# Patient Record
Sex: Male | Born: 1985 | Race: White | Hispanic: No | Marital: Single | Smoking: Current every day smoker
Health system: Southern US, Community
[De-identification: ages and names within clinical notes are randomized; demographics above are authoritative.]

## PROBLEM LIST (undated history)

## (undated) DIAGNOSIS — K219 Gastro-esophageal reflux disease without esophagitis: Secondary | ICD-10-CM

## (undated) DIAGNOSIS — I10 Essential (primary) hypertension: Secondary | ICD-10-CM

## (undated) DIAGNOSIS — F319 Bipolar disorder, unspecified: Secondary | ICD-10-CM

## (undated) DIAGNOSIS — E119 Type 2 diabetes mellitus without complications: Secondary | ICD-10-CM

## (undated) DIAGNOSIS — G4730 Sleep apnea, unspecified: Secondary | ICD-10-CM

## (undated) HISTORY — PX: ABDOMINAL SURGERY: SHX537

## (undated) HISTORY — DX: Gastro-esophageal reflux disease without esophagitis: K21.9

## (undated) HISTORY — PX: HERNIA REPAIR: SHX51

## (undated) HISTORY — DX: Bipolar disorder, unspecified: F31.9

---

## 2020-04-15 ENCOUNTER — Emergency Department (HOSPITAL_COMMUNITY)
Admission: EM | Admit: 2020-04-15 | Discharge: 2020-04-15 | Disposition: A | Discharge status: Home / Self Care | Payer: Medicaid Other | Attending: Emergency Medicine | Admitting: Emergency Medicine

## 2020-04-15 ENCOUNTER — Encounter (HOSPITAL_COMMUNITY): Payer: Self-pay | Admitting: Emergency Medicine

## 2020-04-15 ENCOUNTER — Emergency Department (HOSPITAL_COMMUNITY): Payer: Medicaid Other

## 2020-04-15 ENCOUNTER — Other Ambulatory Visit: Payer: Self-pay

## 2020-04-15 DIAGNOSIS — Y999 Unspecified external cause status: Secondary | ICD-10-CM | POA: Diagnosis not present

## 2020-04-15 DIAGNOSIS — S6991XA Unspecified injury of right wrist, hand and finger(s), initial encounter: Secondary | ICD-10-CM | POA: Diagnosis present

## 2020-04-15 DIAGNOSIS — S62344A Nondisplaced fracture of base of fourth metacarpal bone, right hand, initial encounter for closed fracture: Secondary | ICD-10-CM | POA: Diagnosis not present

## 2020-04-15 DIAGNOSIS — F17210 Nicotine dependence, cigarettes, uncomplicated: Secondary | ICD-10-CM | POA: Diagnosis not present

## 2020-04-15 DIAGNOSIS — Y929 Unspecified place or not applicable: Secondary | ICD-10-CM | POA: Insufficient documentation

## 2020-04-15 DIAGNOSIS — W228XXA Striking against or struck by other objects, initial encounter: Secondary | ICD-10-CM | POA: Insufficient documentation

## 2020-04-15 DIAGNOSIS — Y9389 Activity, other specified: Secondary | ICD-10-CM | POA: Insufficient documentation

## 2020-04-15 HISTORY — DX: Type 2 diabetes mellitus without complications: E11.9

## 2020-04-15 HISTORY — DX: Essential (primary) hypertension: I10

## 2020-04-15 MED ORDER — HYDROCODONE-ACETAMINOPHEN 5-325 MG PO TABS: 1.0000 | ORAL_TABLET | Freq: Four times a day (QID) | ORAL | 0 refills | Status: DC | PRN

## 2020-04-15 MED ORDER — OXYCODONE-ACETAMINOPHEN 5-325 MG PO TABS
1.0000 | ORAL_TABLET | Freq: Once | ORAL | Status: AC
  Administered 2020-04-15: 1 via ORAL
  Filled 2020-04-15: qty 1
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
## 2020-04-15 NOTE — Discharge Instructions (Signed)
You were seen today and likely have a small fracture at the base of your fourth metacarpal.  You also have a lucency over one of the bones in your wrist although you are not tender there.  You need to follow-up with orthopedist for repeat x-rays.  Take medications as prescribed.
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
## 2020-04-24 ENCOUNTER — Ambulatory Visit: Payer: Self-pay | Admitting: Orthopedic Surgery

## 2020-04-24 ENCOUNTER — Other Ambulatory Visit: Payer: Self-pay

## 2020-04-24 ENCOUNTER — Encounter: Payer: Self-pay | Admitting: Orthopedic Surgery

## 2020-04-24 VITALS — BP 176/111 | HR 89 | Ht 75.0 in | Wt 362.0 lb

## 2020-04-24 DIAGNOSIS — S6991XA Unspecified injury of right wrist, hand and finger(s), initial encounter: Secondary | ICD-10-CM
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
## 2020-05-16 ENCOUNTER — Emergency Department (HOSPITAL_COMMUNITY)
Admission: EM | Admit: 2020-05-16 | Discharge: 2020-05-16 | Disposition: A | Discharge status: Home / Self Care | Payer: Medicaid Other | Attending: Emergency Medicine | Admitting: Emergency Medicine

## 2020-05-16 ENCOUNTER — Emergency Department (HOSPITAL_COMMUNITY): Payer: Medicaid Other

## 2020-05-16 ENCOUNTER — Other Ambulatory Visit: Payer: Self-pay

## 2020-05-16 ENCOUNTER — Encounter (HOSPITAL_COMMUNITY): Payer: Self-pay | Admitting: *Deleted

## 2020-05-16 ENCOUNTER — Ambulatory Visit: Payer: Self-pay | Admitting: Orthopedic Surgery

## 2020-05-16 DIAGNOSIS — I10 Essential (primary) hypertension: Secondary | ICD-10-CM | POA: Diagnosis not present

## 2020-05-16 DIAGNOSIS — F17210 Nicotine dependence, cigarettes, uncomplicated: Secondary | ICD-10-CM | POA: Diagnosis not present

## 2020-05-16 DIAGNOSIS — M25461 Effusion, right knee: Secondary | ICD-10-CM | POA: Insufficient documentation

## 2020-05-16 DIAGNOSIS — E119 Type 2 diabetes mellitus without complications: Secondary | ICD-10-CM | POA: Diagnosis not present

## 2020-05-16 DIAGNOSIS — M25561 Pain in right knee: Secondary | ICD-10-CM | POA: Diagnosis not present

## 2020-05-16 MED ORDER — "KNEE IMMOBILIZER 22"" MISC": 1.0000 | Freq: Every day | 0 refills | Status: DC

## 2020-05-16 MED ORDER — HYDROCODONE-ACETAMINOPHEN 5-325 MG PO TABS: 1.0000 | ORAL_TABLET | ORAL | 0 refills | Status: DC | PRN

## 2020-05-16 NOTE — ED Provider Notes (Signed)
[facility] EMERGENCY DEPARTMENT Provider Note   CSN: [id number] Arrival date & time: 05/16/20  1505     History Chief Complaint  Patient presents with  . Knee Pain    Derrick Barnes is a 34 y.o. male with a history of diabetes and hypertension, also reports had a hairline fracture of his right tibia in February when he was living in [place] here with complaint of right knee pain since tripping and falling several days ago.  He was walking and stepped in a hole, describing his body falling to the right while his foot was in the whole, causing pain and stress to his right lateral knee.  He also endorses pain across the top of his kneecap.  He has pain and swelling which is worse with weightbearing, better at rest.  He has applied ice without significant pain improvement.  He is currently under the care of Dr. Romeo Apple for a right fourth metacarpal fracture.  The history is provided by the patient.       Past Medical History:  Diagnosis Date  . Diabetes mellitus without complication (HCC)   . Hypertension     There are no problems to display for this patient.   Past Surgical History:  Procedure Laterality Date  . ABDOMINAL SURGERY    . HERNIA REPAIR         History reviewed. No pertinent family history.  Social History   Tobacco Use  . Smoking status: Current Every Day Smoker    Packs/day: 0.25    Types: Cigarettes  . Smokeless tobacco: Never Used  Substance Use Topics  . Alcohol use: Not Currently  . Drug use: Not Currently    Home Medications Prior to Admission medications   Medication Sig Start Date End Date Taking? Authorizing Provider  Elastic Bandages & Supports (KNEE IMMOBILIZER 22") MISC 1 Device by Does not apply route daily. 05/16/20   Burgess Amor, PA-C  HYDROcodone-acetaminophen (NORCO/VICODIN) 5-325 MG tablet Take 1 tablet by mouth every 4 (four) hours as needed for moderate pain. 05/16/20   Burgess Amor, PA-C  ibuprofen (ADVIL) 800 MG tablet Take 800 mg by  mouth every 8 (eight) hours as needed.    [provider]    Allergies    Trulicity [dulaglutide]  Review of Systems   Review of Systems  Constitutional: Negative for fever.  Musculoskeletal: Positive for arthralgias and joint swelling. Negative for myalgias.  Skin: Negative.   Neurological: Negative for weakness and numbness.  All other systems reviewed and are negative.   Physical Exam Updated Vital Signs BP (!) 144/102   Pulse 83   Temp (!) 97.1 F (36.2 C) (Temporal)   Resp 18   Ht 6\' 3"  (1.905 m)   Wt (!) 164.2 kg   SpO2 99%   BMI 45.25 kg/m   Physical Exam Constitutional:      Appearance: He is well-developed.  HENT:     Head: Atraumatic.  Cardiovascular:     Comments: Pulses equal bilaterally Musculoskeletal:        General: Tenderness present.     Cervical back: Normal range of motion.     Right knee: Bony tenderness present. No swelling or deformity. No LCL laxity, MCL laxity, ACL laxity or PCL laxity.     Comments: Tender to palpation along the lateral right knee joint space. Pain elicited to palpation of the LCL and across the upper patellar border.  No palpable tendon deformity.  Pt can SLR the right leg without knee  pain or collapse. No crepitus.  No knee joint instability.  No effusion.  Skin:    General: Skin is warm and dry.  Neurological:     Mental Status: He is alert.     Sensory: No sensory deficit.     Deep Tendon Reflexes: Reflexes normal.     ED Results / Procedures / Treatments   Labs (all labs ordered are listed, but only abnormal results are displayed) Labs Reviewed - No data to display  EKG None  Radiology DG Knee Complete 4 Views Right  Result Date: 05/16/2020 CLINICAL DATA:  Pain after prior fall EXAM: RIGHT KNEE - COMPLETE 4+ VIEW COMPARISON:  None. FINDINGS: Frontal, lateral, and bilateral oblique views were obtained. No fracture or dislocation. There is a joint effusion. The patella is somewhat inferiorly positioned.  There is no appreciable joint space narrowing or erosion. IMPRESSION: Somewhat inferiorly positioned patella. There is a joint effusion. No appreciable fracture or dislocation. No appreciable underlying arthropathic change. Electronically Signed   By: Bretta Bang III M.D.   On: 05/16/2020 16:52    Procedures Procedures (including critical care time)  Medications Ordered in ED Medications - No data to display  ED Course  I have reviewed the triage vital signs and the nursing notes.  Pertinent labs & imaging results that were available during my care of the patient were reviewed by me and considered in my medical decision making (see chart for details).    MDM Rules/Calculators/A&P                          Imaging reviewed and discussed with pt. Imaging suggesting low riding patella, but no exam findings suggesting tendon or muscle rupture of thigh musculature.  ttp lateral knee joint space.  Small effusion.  Suspect knee sprain involving the patella and LCL.  Body habitus does not accommodate our knee immobilizers.  He was given a script to have one fitted at [facility], crutches provided.  Discussed home care of [facility] and close f/u with Dr. Romeo Apple for eval/management of this new injury. Final Clinical Impression(s) / ED Diagnoses Final diagnoses:  Acute pain of right knee  Effusion of right knee    Rx / DC Orders ED Discharge Orders         Ordered    HYDROcodone-acetaminophen (NORCO/VICODIN) 5-325 MG tablet  Every 4 hours PRN     Discontinue  Reprint     05/16/20 1816    Elastic Bandages & Supports (KNEE IMMOBILIZER 22") MISC  Daily     Discontinue  Reprint     05/16/20 1920           Burgess Amor, PA-C 05/17/20 1047    Terrilee Files, MD 05/17/20 1121

## 2020-05-16 NOTE — Discharge Instructions (Signed)
As discussed you do have an effusion in your right knee joint space suspicious for soft tissue injury, possibly a tendon or ligament injury.  Wear the knee immobilizer as discussed to help protect your knee joint.  Use the crutches to minimize weightbearing as needed.  Applying ice to the knee as much as possible will help also with pain and swelling.  You have been prescribed hydrocodone to help you with pain relief, do not drive within 4 hours of taking this medication as it will make you drowsy.  Call Dr. Romeo Apple for an office visit for further evaluation of this new injury.

## 2020-05-16 NOTE — ED Triage Notes (Signed)
Pt fell 6 months ago and injured right knee, states pain is getting worse.  Hard to put weight on it per pt.

## 2020-05-18 ENCOUNTER — Telehealth: Payer: Self-pay | Admitting: Orthopedic Surgery

## 2020-05-18 NOTE — Telephone Encounter (Signed)
Upon re-scheduling patient's appointment from 05/16/20 which patient missed, he stated he has a new problem, knee pain, for which he was seen at [facility] emergency room on 05/16/20. Discussed appointment, as well as reviewed his new Medicaid insurance plan, [employer]; relayed [facility] is out of network. He will pursue with his caseworker and/or with Medicaid for further information.

## 2020-05-24 ENCOUNTER — Ambulatory Visit: Payer: Medicaid Other | Admitting: Orthopedic Surgery

## 2020-05-28 ENCOUNTER — Ambulatory Visit (INDEPENDENT_AMBULATORY_CARE_PROVIDER_SITE_OTHER): Payer: Medicaid Other | Admitting: Physician Assistant

## 2020-05-28 ENCOUNTER — Encounter: Payer: Self-pay | Admitting: Physician Assistant

## 2020-05-28 ENCOUNTER — Other Ambulatory Visit: Payer: Self-pay

## 2020-05-28 ENCOUNTER — Ambulatory Visit (INDEPENDENT_AMBULATORY_CARE_PROVIDER_SITE_OTHER): Payer: Medicaid Other

## 2020-05-28 DIAGNOSIS — M25561 Pain in right knee: Secondary | ICD-10-CM | POA: Diagnosis not present

## 2020-05-28 DIAGNOSIS — G8929 Other chronic pain: Secondary | ICD-10-CM | POA: Diagnosis not present

## 2020-05-28 DIAGNOSIS — M25361 Other instability, right knee: Secondary | ICD-10-CM | POA: Diagnosis not present

## 2020-05-28 NOTE — Progress Notes (Signed)
Office Visit Note   Patient: Derrick Barnes           Date of Birth: [birth year]           MRN: [id number] Visit Date: 05/28/2020              Requested by: No referring provider defined for this encounter. PCP: Patient, No Pcp Per  Chief Complaint  Patient presents with   Right Knee - Pain      HPI: This is a pleasant 34 year old gentleman with a 73-month history of right knee pain swelling and instability.  He states he had a fall when he was living in [place] in February of this year.  He had pain and swelling.  He was seen by a provider who told him he had a small fracture of his tibia.  He still had some difficulties but got somewhat better.  Approximately 5 weeks ago he jumped off a porch landing awkwardly on his knee.  Since then he has had increased global pain and swelling.  He states that his knee gives way and he falls because of this.  He also has a sense of locking and catching.  He does think he appreciated a pop when he sustained his second injury it hurts him to walk and he works as a Designer, jewellery & Plan: Visit Diagnoses:  1. Chronic pain of right knee   2. Knee instability, right     Plan: Patient has had ongoing knee pain and instability more accentuated since June.  He has locking and catching and popping.  He has an antalgic gait and has fallen secondary to the instability.  I recommend a MRI with follow-up afterwards.  I did offer him an injection into his knee today he declined this  Follow-Up Instructions: No follow-ups on file.   Ortho Exam  Patient is alert, oriented, no adenopathy, well-dressed, normal affect, normal respiratory effort. Right knee: Moderate soft tissue swelling but no frank effusion.  Tender around the medial lateral joint lines.  Has some increased translation with anterior draw pain with terminal extension and flexion.  No cellulitis  Imaging: No results found. No images are attached to the encounter.  Labs: No  results found for: HGBA1C, ESRSEDRATE, CRP, LABURIC, REPTSTATUS, GRAMSTAIN, CULT, LABORGA   No results found for: ALBUMIN, PREALBUMIN, LABURIC  No results found for: MG No results found for: VD25OH  No results found for: PREALBUMIN No flowsheet data found.   There is no height or weight on file to calculate BMI.  Orders:  Orders Placed This Encounter  Procedures   XR Knee 1-2 Views Right   MR Knee Right w/o contrast   No orders of the defined types were placed in this encounter.    Procedures: No procedures performed  Clinical Data: No additional findings.  ROS:  All other systems negative, except as noted in the HPI. Review of Systems  Objective: Vital Signs: There were no vitals taken for this visit.  Specialty Comments:  No specialty comments available.  PMFS History: There are no problems to display for this patient.  No past medical history on file.  No family history on file.   Social History   Occupational History   Not on file  Tobacco Use   Smoking status: Not on file  Substance and Sexual Activity   Alcohol use: Not on file   Drug use: Not on file   Sexual activity: Not on file

## 2020-05-29 ENCOUNTER — Encounter (HOSPITAL_COMMUNITY): Payer: Self-pay | Admitting: *Deleted

## 2020-06-19 ENCOUNTER — Telehealth: Payer: Self-pay | Admitting: Orthopedic Surgery

## 2020-06-19 NOTE — Telephone Encounter (Signed)
Pt has been called twice with the the schedulers at [facility] and they come up with this: Missing or Invalid Number: Details Recording: "The subscriber you have dialed is not in service."  I called and I get number can not be completed at this time, please hang up and try again later.

## 2020-06-19 NOTE — Telephone Encounter (Signed)
Pt called stating a referral for an MRI in [place] was supposed to be placed but it's been two weeks and no one has called him; pt would like a CB with an update  [phone]

## 2020-06-19 NOTE — Telephone Encounter (Signed)
Can you follow up on this? Pt had MRI order 05/28/20 and still has not heard back but see that Berkley Harvey has been obtained.

## 2020-06-20 ENCOUNTER — Encounter: Payer: Self-pay | Admitting: Radiology

## 2020-06-20 NOTE — Telephone Encounter (Signed)
Thanks, I changed the number in the demographic to where imaging can see it also and took the old one out. I also sent message to imaging letting them know to contact pt again to schedule.

## 2020-06-20 NOTE — Telephone Encounter (Signed)
Noted  

## 2020-06-20 NOTE — Telephone Encounter (Signed)
I also tried to call the pt at the number provided [phone] recording says unable to complete call at this time and to try again later. Called the number listed as a home number in his chart [phone] and was able to speak with the pt. He said that this number would be the best number to reach him the other is not a working number for him any longer. Can we please call to set up this MRI?

## 2020-06-30 ENCOUNTER — Other Ambulatory Visit: Payer: Self-pay

## 2020-06-30 ENCOUNTER — Ambulatory Visit (HOSPITAL_BASED_OUTPATIENT_CLINIC_OR_DEPARTMENT_OTHER)
Admission: RE | Admit: 2020-06-30 | Discharge: 2020-06-30 | Disposition: A | Discharge status: Home / Self Care | Payer: Medicaid Other | Source: Ambulatory Visit | Attending: Physician Assistant | Admitting: Physician Assistant

## 2020-06-30 DIAGNOSIS — M25361 Other instability, right knee: Secondary | ICD-10-CM | POA: Diagnosis not present

## 2020-07-05 ENCOUNTER — Emergency Department (HOSPITAL_COMMUNITY): Payer: Medicaid Other

## 2020-07-05 ENCOUNTER — Other Ambulatory Visit: Payer: Self-pay

## 2020-07-05 ENCOUNTER — Encounter (HOSPITAL_COMMUNITY): Payer: Self-pay | Admitting: *Deleted

## 2020-07-05 ENCOUNTER — Emergency Department (HOSPITAL_COMMUNITY)
Admission: EM | Admit: 2020-07-05 | Discharge: 2020-07-05 | Disposition: A | Discharge status: Home / Self Care | Payer: Medicaid Other | Attending: Emergency Medicine | Admitting: Emergency Medicine

## 2020-07-05 DIAGNOSIS — I10 Essential (primary) hypertension: Secondary | ICD-10-CM | POA: Diagnosis not present

## 2020-07-05 DIAGNOSIS — Y9389 Activity, other specified: Secondary | ICD-10-CM | POA: Insufficient documentation

## 2020-07-05 DIAGNOSIS — Y9289 Other specified places as the place of occurrence of the external cause: Secondary | ICD-10-CM | POA: Insufficient documentation

## 2020-07-05 DIAGNOSIS — S80911A Unspecified superficial injury of right knee, initial encounter: Secondary | ICD-10-CM | POA: Diagnosis present

## 2020-07-05 DIAGNOSIS — S83241A Other tear of medial meniscus, current injury, right knee, initial encounter: Secondary | ICD-10-CM | POA: Diagnosis not present

## 2020-07-05 DIAGNOSIS — F17210 Nicotine dependence, cigarettes, uncomplicated: Secondary | ICD-10-CM | POA: Insufficient documentation

## 2020-07-05 DIAGNOSIS — X58XXXA Exposure to other specified factors, initial encounter: Secondary | ICD-10-CM | POA: Insufficient documentation

## 2020-07-05 DIAGNOSIS — Y998 Other external cause status: Secondary | ICD-10-CM | POA: Insufficient documentation

## 2020-07-05 DIAGNOSIS — E119 Type 2 diabetes mellitus without complications: Secondary | ICD-10-CM | POA: Insufficient documentation

## 2020-07-05 MED ORDER — MELOXICAM 7.5 MG PO TABS
7.5000 mg | ORAL_TABLET | Freq: Two times a day (BID) | ORAL | 0 refills | Status: AC | PRN
Start: 2020-07-05 — End: 2020-07-19

## 2020-07-05 NOTE — ED Triage Notes (Signed)
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

## 2020-07-05 NOTE — ED Notes (Signed)
Pt states he can't put weight on that right knee

## 2020-07-05 NOTE — Discharge Instructions (Addendum)
Please follow-up with your orthopedic surgeon.  You may take either ibuprofen or Mobic as prescribed, do not take both of these together.  Rest your knee, use ice packs and elevate to help reduce swelling.  Try to not walk on this for the next couple of days  Medical exam in the emergency department for severe worsening symptoms.

## 2020-07-05 NOTE — ED Provider Notes (Signed)
[facility] EMERGENCY DEPARTMENT Provider Note   CSN: [id number] Arrival date & time: 07/05/20  2138     History Chief Complaint  Patient presents with  . Fall    Derrick Barnes is a 34 y.o. male.  HPI   This patient is a 34 year old male, he is a known diabetic as well as having high blood pressure.  He had a knee injury which she has been struggling with and had an orthopedic surgeon evaluation.  An MRI was ordered which showed a torn meniscus medial on the right knee.  That was several days ago.  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  He then had difficulty straightening or bending the knee and or walking on it.  He has not had any medication for this, it is constant, it does get better with certain positions and now he holds it in slight flexion because of pain relief.  He denies any other injuries.  Past Medical History:  Diagnosis Date  . Diabetes mellitus without complication (HCC)   . Hypertension     There are no problems to display for this patient.   Past Surgical History:  Procedure Laterality Date  . ABDOMINAL SURGERY    . HERNIA REPAIR         History reviewed. No pertinent family history.  Social History   Tobacco Use  . Smoking status: Current Every Day Smoker    Packs/day: 0.25    Types: Cigarettes  . Smokeless tobacco: Never Used  Substance Use Topics  . Alcohol use: Not Currently  . Drug use: Not Currently    Home Medications Prior to Admission medications   Medication Sig Start Date End Date Taking? Authorizing Provider  Elastic Bandages & Supports (KNEE IMMOBILIZER 22") MISC 1 Device by Does not apply route daily. 05/16/20   Burgess Amor, PA-C  HYDROcodone-acetaminophen (NORCO/VICODIN) 5-325 MG tablet Take 1 tablet by mouth every 4 (four) hours as needed for moderate pain. 05/16/20   Burgess Amor, PA-C    ibuprofen (ADVIL) 800 MG tablet Take 800 mg by mouth every 8 (eight) hours as needed.    [provider]  meloxicam (MOBIC) 7.5 MG tablet Take 1 tablet (7.5 mg total) by mouth 2 (two) times daily as needed for up to 14 days for pain. 07/05/20 07/19/20  Eber Hong, MD    Allergies    Alfalfa and Trulicity [dulaglutide]  Review of Systems   Review of Systems  Gastrointestinal: Negative for nausea and vomiting.  Musculoskeletal: Positive for joint swelling.  Neurological: Negative for weakness and numbness.    Physical Exam Updated Vital Signs BP (!) 151/98 (BP Location: Left Arm)   Pulse 93   Temp 98.4 F (36.9 C) (Oral)   Resp 16   Ht 1.905 m (6\' 3" )   Wt (!) 158.8 kg   SpO2 99%   BMI 43.75 kg/m   Physical Exam Vitals and nursing note reviewed.  Constitutional:      Appearance: He is well-developed. He is not diaphoretic.  HENT:     Head: Normocephalic and atraumatic.  Eyes:     General:        Right eye: No discharge.        Left eye: No discharge.     Conjunctiva/sclera: Conjunctivae normal.  Pulmonary:  Effort: Pulmonary effort is normal. No respiratory distress.  Musculoskeletal:     Comments: Mild swelling of the right knee, he is able to fully extend and fully flex but with pain.  He has a clinical effusion which is small.  He has tenderness over both the medial and lateral aspects of the knee joint as well as over the anterior aspect.  There is no bruising or injury to the skin  Skin:    General: Skin is warm and dry.     Findings: No erythema or rash.  Neurological:     Mental Status: He is alert.     Coordination: Coordination normal.     ED Results / Procedures / Treatments   Labs (all labs ordered are listed, but only abnormal results are displayed) Labs Reviewed - No data to display  EKG None  Radiology DG Knee Complete 4 Views Right  Result Date: 07/05/2020 CLINICAL DATA:  Fall, right knee pain EXAM: RIGHT KNEE - COMPLETE 4+ VIEW  COMPARISON:  None. FINDINGS: Four view radiograph right knee demonstrates normal alignment. No fracture or dislocation. Joint spaces are preserved. Small right knee effusion is present. Soft tissues are otherwise unremarkable. IMPRESSION: Small right knee effusion. No acute bony abnormality. Electronically Signed   By: Helyn Numbers MD   On: 07/05/2020 22:18    Procedures Procedures (including critical care time)  Medications Ordered in ED Medications - No data to display  ED Course  I have reviewed the triage vital signs and the nursing notes.  Pertinent labs & imaging results that were available during my care of the patient were reviewed by me and considered in my medical decision making (see chart for details).    MDM Rules/Calculators/A&P                          Small clinical effusion, internal derangement of the knee has already occurred with a medial meniscus tear based on his MRI report which I have reviewed.  The patient will need orthopedic follow-up.  He likely has a sprain of the knee.  He already has crutches and states he does not fit into knee immobilizers.  I have offered him each of these.  He is stable for discharge, can follow-up in the outpatient setting.  Anti-inflammatories and orthopedic follow-up  Final Clinical Impression(s) / ED Diagnoses Final diagnoses:  Tear of medial meniscus of right knee, current, unspecified tear type, initial encounter    Rx / DC Orders ED Discharge Orders         Ordered    meloxicam (MOBIC) 7.5 MG tablet  2 times daily PRN        07/05/20 2237           Eber Hong, MD 07/05/20 2237

## 2020-11-21 ENCOUNTER — Emergency Department (HOSPITAL_COMMUNITY)
Admission: EM | Admit: 2020-11-21 | Discharge: 2020-11-21 | Disposition: A | Discharge status: Home / Self Care | Payer: Medicaid Other | Attending: Emergency Medicine | Admitting: Emergency Medicine

## 2020-11-21 ENCOUNTER — Other Ambulatory Visit: Payer: Self-pay

## 2020-11-21 ENCOUNTER — Encounter (HOSPITAL_COMMUNITY): Payer: Self-pay

## 2020-11-21 DIAGNOSIS — R059 Cough, unspecified: Secondary | ICD-10-CM

## 2020-11-21 DIAGNOSIS — E119 Type 2 diabetes mellitus without complications: Secondary | ICD-10-CM | POA: Insufficient documentation

## 2020-11-21 DIAGNOSIS — I10 Essential (primary) hypertension: Secondary | ICD-10-CM | POA: Insufficient documentation

## 2020-11-21 DIAGNOSIS — R509 Fever, unspecified: Secondary | ICD-10-CM | POA: Diagnosis present

## 2020-11-21 DIAGNOSIS — Z20822 Contact with and (suspected) exposure to covid-19: Secondary | ICD-10-CM

## 2020-11-21 DIAGNOSIS — U071 COVID-19: Secondary | ICD-10-CM | POA: Diagnosis not present

## 2020-11-21 DIAGNOSIS — J029 Acute pharyngitis, unspecified: Secondary | ICD-10-CM

## 2020-11-21 DIAGNOSIS — F17210 Nicotine dependence, cigarettes, uncomplicated: Secondary | ICD-10-CM | POA: Diagnosis not present

## 2020-11-21 DIAGNOSIS — Z8616 Personal history of COVID-19: Secondary | ICD-10-CM

## 2020-11-21 HISTORY — DX: Personal history of COVID-19: Z86.16

## 2020-11-21 LAB — GROUP A STREP BY PCR: Group A Strep by PCR: NOT DETECTED

## 2020-11-21 MED ORDER — NAPROXEN 250 MG PO TABS
500.0000 mg | ORAL_TABLET | Freq: Once | ORAL | Status: AC
  Administered 2020-11-21: 500 mg via ORAL
  Filled 2020-11-21: qty 2

## 2020-11-21 MED ORDER — BENZONATATE 100 MG PO CAPS
100.0000 mg | ORAL_CAPSULE | Freq: Three times a day (TID) | ORAL | 0 refills | Status: DC
Start: 2020-11-21 — End: 2021-01-14

## 2020-11-21 NOTE — ED Provider Notes (Signed)
[facility] EMERGENCY DEPARTMENT Provider Note   CSN: [id number] Arrival date & time: 11/21/20  1425     History Chief Complaint  Patient presents with  . Generalized Body Aches    Derrick Barnes is a 35 y.o. male with positive history significant for diabetes, hypertension who presents for evaluation of fever.  Woke this morning with a fever of 103.0.  He has also had myalgias, sore throat.  States he has a cough however has a chronic cough due to tobacco use.  No chest pain or shortness of breath.  He received 2 doses of COVID-vaccine who was not due for booster until March.  No known exposures.  Is able to tolerate p.o. intake without difficulty.  States his throat feels "scratchy."  Denies unilateral neck pain, drooling, dysphagia, trismus, congestion, rhinorrhea, chest pain, shortness of breath abdominal pain, diarrhea, dysuria.  Denies additional aggravating or alleviating factors.  Took Tylenol 2 hours PTA.  History obtained from patient and past medical records. No interpretor was used.  HPI     Past Medical History:  Diagnosis Date  . Diabetes mellitus without complication (HCC)   . Hypertension     There are no problems to display for this patient.   Past Surgical History:  Procedure Laterality Date  . ABDOMINAL SURGERY    . HERNIA REPAIR         No family history on file.  Social History   Tobacco Use  . Smoking status: Current Every Day Smoker    Packs/day: 0.25    Types: Cigarettes  . Smokeless tobacco: Never Used  Substance Use Topics  . Alcohol use: Not Currently  . Drug use: Not Currently    Home Medications Prior to Admission medications   Medication Sig Start Date End Date Taking? Authorizing Provider  benzonatate (TESSALON) 100 MG capsule Take 1 capsule (100 mg total) by mouth every 8 (eight) hours. 11/21/20  Yes Izaiyah Kleinman A, PA-C  Elastic Bandages & Supports (KNEE IMMOBILIZER 22") MISC 1 Device by Does not apply route daily. 05/16/20    Burgess Amor, PA-C  HYDROcodone-acetaminophen (NORCO/VICODIN) 5-325 MG tablet Take 1 tablet by mouth every 4 (four) hours as needed for moderate pain. 05/16/20   Burgess Amor, PA-C  ibuprofen (ADVIL) 800 MG tablet Take 800 mg by mouth every 8 (eight) hours as needed.    [provider]    Allergies    Alfalfa and Trulicity [dulaglutide]  Review of Systems   Review of Systems  Constitutional: Positive for activity change, appetite change, fatigue and fever.  HENT: Positive for congestion, rhinorrhea and sore throat. Negative for trouble swallowing and voice change.   Respiratory: Positive for cough.   Cardiovascular: Negative.   Genitourinary: Negative.   Musculoskeletal: Negative.   Skin: Negative.   Neurological: Negative.   All other systems reviewed and are negative.   Physical Exam Updated Vital Signs BP 132/87 (BP Location: Right Arm)   Pulse (!) 102   Temp 99.4 F (37.4 C) (Oral)   Resp 18   Ht 6\' 4"  (1.93 m)   Wt (!) 152 kg   SpO2 100%   BMI 40.78 kg/m   Physical Exam Vitals and nursing note reviewed.  Constitutional:      General: He is not in acute distress.    Appearance: He is not ill-appearing, toxic-appearing or diaphoretic.  HENT:     Head: Normocephalic and atraumatic.     Jaw: There is normal jaw occlusion.  Right Ear: Tympanic membrane, ear canal and external ear normal. There is no impacted cerumen. No hemotympanum. Tympanic membrane is not injected, scarred, perforated, erythematous, retracted or bulging.     Left Ear: Tympanic membrane, ear canal and external ear normal. There is no impacted cerumen. No hemotympanum. Tympanic membrane is not injected, scarred, perforated, erythematous, retracted or bulging.     Ears:     Comments: No Mastoid tenderness.    Nose:     Comments: Clear rhinorrhea and congestion to bilateral nares.  No sinus tenderness.    Mouth/Throat:     Comments: Posterior oropharynx clear.  Mucous membranes moist.  Tonsils  without erythema or exudate.  Uvula midline without deviation.  No evidence of PTA or RPA.  No drooling, dysphasia or trismus.  Phonation normal. Neck:     Trachea: Trachea and phonation normal.     Comments: No Neck stiffness or neck rigidity.  No meningismus.  No cervical lymphadenopathy. Cardiovascular:     Comments: No murmurs rubs or gallops. Pulmonary:     Comments: Clear to auscultation bilaterally without wheeze, rhonchi or rales.  No accessory muscle usage.  Able speak in full sentences. Abdominal:     Comments: Soft, nontender without rebound or guarding.  No CVA tenderness.  Musculoskeletal:     Comments: Moves all 4 extremities without difficulty.  Lower extremities without edema, erythema or warmth.  Skin:    Comments: Brisk capillary refill.  No rashes or lesions.  Neurological:     Mental Status: He is alert.     Comments: Ambulatory in department without difficulty.  Cranial nerves II through XII grossly intact.  No facial droop.  No aphasia.     ED Results / Procedures / Treatments   Labs (all labs ordered are listed, but only abnormal results are displayed) Labs Reviewed  GROUP A STREP BY PCR  SARS CORONAVIRUS 2 (TAT 6-24 HRS)    EKG None  Radiology No results found.  Procedures Procedures (including critical care time)  Medications Ordered in ED Medications  naproxen (NAPROSYN) tablet 500 mg (500 mg Oral Given 11/21/20 1638)   ED Course  I have reviewed the triage vital signs and the nursing notes.  Pertinent labs & imaging results that were available during my care of the patient were reviewed by me and considered in my medical decision making (see chart for details).  Patient presents for evaluation of upper respiratory complaints. On arrival he is afebrile, nonseptic, not ill-appearing.  No evidence of PTA or RPA.  No phonation changes.  No neck stiffness or neck rigidity.  No meningismus.  His heart and lungs are clear.  No unilateral leg swelling,  redness or warmth.  Strep test performed here did not show evidence of strep pharyngitis.  Question viral upper respiratory infection, question COVID?  COVID test was obtained.  He will follow-up on results on MyChart.  I discussed symptomatic management at home.  He is agreeable for this. Will return for new or worsening symptoms.  low suspicion for acute bacterial infection at this time requiring antibiotics or admission to hospital  The patient has been appropriately medically screened and/or stabilized in the ED. I have low suspicion for any other emergent medical condition which would require further screening, evaluation or treatment in the ED or require inpatient management.  Patient is hemodynamically stable and in no acute distress.  Patient able to ambulate in department prior to ED.  Evaluation does not show acute pathology that would require  ongoing or additional emergent interventions while in the emergency department or further inpatient treatment.  I have discussed the diagnosis with the patient and answered all questions.  Pain is been managed while in the emergency department and patient has no further complaints prior to discharge.  Patient is comfortable with plan discussed in room and is stable for discharge at this time.  I have discussed strict return precautions for returning to the emergency department.  Patient was encouraged to follow-up with PCP/specialist refer to at discharge.    MDM Rules/Calculators/A&P                          Derrick Barnes was evaluated in Emergency Department on 11/21/2020 for the symptoms described in the history of present illness. He was evaluated in the context of the global COVID-19 pandemic, which necessitated consideration that the patient might be at risk for infection with the SARS-CoV-2 virus that causes COVID-19. Institutional protocols and algorithms that pertain to the evaluation of patients at risk for COVID-19 are in a state of rapid change  based on information released by regulatory bodies including the CDC and federal and state organizations. These policies and algorithms were followed during the patient's care in the ED. Final Clinical Impression(s) / ED Diagnoses Final diagnoses:  Person under investigation for COVID-19  Sore throat  Cough    Rx / DC Orders ED Discharge Orders         Ordered    benzonatate (TESSALON) 100 MG capsule  Every 8 hours        11/21/20 1733           Twain Stenseth A, PA-C 11/21/20 1737    Eber Hong, MD 11/22/20 2129

## 2020-11-21 NOTE — ED Triage Notes (Signed)
Pt presents to ED with complaints of generalized body aches, sore throat, fever up to 103 started this am.

## 2020-11-21 NOTE — Discharge Instructions (Addendum)
Your strep test was negative  Your COVID test was pending at discharge.  As we discussed in the room.  These results will be on MyChart.  On the bottom of your discharge instructions there are instructions on how to set up this online account to get your results.  May take Tylenol and ibuprofen as needed for pain.  Return for new or worsening symptoms.  If COVID test is positive will need to be out of work for 1 week.  I have written a work note for that if your COVID test result is positive

## 2020-11-22 LAB — SARS CORONAVIRUS 2 (TAT 6-24 HRS): SARS Coronavirus 2: POSITIVE — AB

## 2020-12-20 ENCOUNTER — Ambulatory Visit (INDEPENDENT_AMBULATORY_CARE_PROVIDER_SITE_OTHER): Payer: Medicaid Other | Admitting: Orthopedic Surgery

## 2020-12-20 DIAGNOSIS — M23221 Derangement of posterior horn of medial meniscus due to old tear or injury, right knee: Secondary | ICD-10-CM | POA: Diagnosis not present

## 2020-12-28 ENCOUNTER — Encounter: Payer: Self-pay | Admitting: Orthopedic Surgery

## 2020-12-28 NOTE — Progress Notes (Signed)
   Office Visit Note   Patient: Derrick Barnes           Date of Birth: 08-Nov-1986           MRN: [id number] Visit Date: 12/20/2020              Requested by: [facility] [address] [place],  [place] [address] PCP: Ponciano Ort, [facility]  Chief Complaint  Patient presents with  . Right Knee - Pain      HPI: Patient is a 35 year old gentleman who presents with progressive pain in his right knee he states he is fallen 4 times has grinding in the knee. MRI scan obtained last year showed a torn meniscus. Patient currently is ambulating with a cane complains of global pain and swelling start up stiffness locking and giving way.  Assessment & Plan: Visit Diagnoses:  1. Derang of post horn of medial mensc d/t old tear/inj, r knee     Plan: We will plan for arthroscopic debridement of the right knee risk and benefits were discussed including persistent pain need for additional surgery. Patient states he understands wished to proceed at this time.  Follow-Up Instructions: Return if symptoms worsen or fail to improve.   Ortho Exam  Patient is alert, oriented, no adenopathy, well-dressed, normal affect, normal respiratory effort. Examination patient is tender to palpation of the medial joint line there is no effusion collaterals and cruciates are stable there is no cellulitis. There is crepitation with range of motion.  Imaging: No results found. No images are attached to the encounter.  Labs: No results found for: HGBA1C, ESRSEDRATE, CRP, LABURIC, REPTSTATUS, GRAMSTAIN, CULT, LABORGA   No results found for: ALBUMIN, PREALBUMIN, LABURIC  No results found for: MG No results found for: VD25OH  No results found for: PREALBUMIN No flowsheet data found.   There is no height or weight on file to calculate BMI.  Orders:  No orders of the defined types were placed in this encounter.  No orders of the defined types were placed in this encounter.    Procedures: No  procedures performed  Clinical Data: No additional findings.  ROS:  All other systems negative, except as noted in the HPI. Review of Systems  Objective: Vital Signs: There were no vitals taken for this visit.  Specialty Comments:  No specialty comments available.  PMFS History: There are no problems to display for this patient.  Past Medical History:  Diagnosis Date  . Diabetes mellitus without complication (HCC)   . Hypertension     History reviewed. No pertinent family history.  Past Surgical History:  Procedure Laterality Date  . ABDOMINAL SURGERY    . HERNIA REPAIR     Social History   Occupational History  . Not on file  Tobacco Use  . Smoking status: Current Every Day Smoker    Packs/day: 0.25    Types: Cigarettes  . Smokeless tobacco: Never Used  Substance and Sexual Activity  . Alcohol use: Not Currently  . Drug use: Not Currently  . Sexual activity: Not on file

## 2021-01-01 ENCOUNTER — Other Ambulatory Visit: Payer: Self-pay | Admitting: Physician Assistant

## 2021-01-14 ENCOUNTER — Other Ambulatory Visit: Payer: Self-pay

## 2021-01-14 ENCOUNTER — Encounter (HOSPITAL_BASED_OUTPATIENT_CLINIC_OR_DEPARTMENT_OTHER): Payer: Self-pay | Admitting: Orthopedic Surgery

## 2021-01-18 ENCOUNTER — Encounter (HOSPITAL_BASED_OUTPATIENT_CLINIC_OR_DEPARTMENT_OTHER)
Admission: RE | Admit: 2021-01-18 | Discharge: 2021-01-18 | Disposition: A | Discharge status: Home / Self Care | Payer: Medicaid Other | Attending: Orthopedic Surgery | Admitting: Orthopedic Surgery

## 2021-01-18 DIAGNOSIS — Z01812 Encounter for preprocedural laboratory examination: Secondary | ICD-10-CM | POA: Diagnosis present

## 2021-01-18 NOTE — Progress Notes (Signed)

## 2021-01-18 NOTE — Progress Notes (Signed)
Patient had lab work on 01/11/21 including BMET. New labs were not collected. EKG was performed.

## 2021-01-21 ENCOUNTER — Other Ambulatory Visit: Payer: Self-pay

## 2021-01-22 ENCOUNTER — Other Ambulatory Visit: Payer: Self-pay

## 2021-01-22 ENCOUNTER — Ambulatory Visit (HOSPITAL_BASED_OUTPATIENT_CLINIC_OR_DEPARTMENT_OTHER): Payer: Medicaid Other | Admitting: Anesthesiology

## 2021-01-22 ENCOUNTER — Encounter (HOSPITAL_BASED_OUTPATIENT_CLINIC_OR_DEPARTMENT_OTHER): Payer: Self-pay | Admitting: Orthopedic Surgery

## 2021-01-22 ENCOUNTER — Ambulatory Visit (HOSPITAL_BASED_OUTPATIENT_CLINIC_OR_DEPARTMENT_OTHER)
Admission: RE | Admit: 2021-01-22 | Discharge: 2021-01-22 | Disposition: A | Discharge status: Home / Self Care | Payer: Medicaid Other | Attending: Orthopedic Surgery | Admitting: Orthopedic Surgery

## 2021-01-22 ENCOUNTER — Encounter (HOSPITAL_BASED_OUTPATIENT_CLINIC_OR_DEPARTMENT_OTHER)
Admission: RE | Disposition: A | Discharge status: Home / Self Care | Payer: Self-pay | Source: Home / Self Care | Attending: Orthopedic Surgery

## 2021-01-22 DIAGNOSIS — X58XXXA Exposure to other specified factors, initial encounter: Secondary | ICD-10-CM | POA: Insufficient documentation

## 2021-01-22 DIAGNOSIS — S83241A Other tear of medial meniscus, current injury, right knee, initial encounter: Secondary | ICD-10-CM | POA: Insufficient documentation

## 2021-01-22 DIAGNOSIS — E119 Type 2 diabetes mellitus without complications: Secondary | ICD-10-CM | POA: Insufficient documentation

## 2021-01-22 DIAGNOSIS — Z888 Allergy status to other drugs, medicaments and biological substances status: Secondary | ICD-10-CM | POA: Insufficient documentation

## 2021-01-22 DIAGNOSIS — I10 Essential (primary) hypertension: Secondary | ICD-10-CM | POA: Diagnosis not present

## 2021-01-22 DIAGNOSIS — Z7984 Long term (current) use of oral hypoglycemic drugs: Secondary | ICD-10-CM | POA: Diagnosis not present

## 2021-01-22 DIAGNOSIS — F17210 Nicotine dependence, cigarettes, uncomplicated: Secondary | ICD-10-CM | POA: Insufficient documentation

## 2021-01-22 DIAGNOSIS — G4730 Sleep apnea, unspecified: Secondary | ICD-10-CM | POA: Insufficient documentation

## 2021-01-22 DIAGNOSIS — M1711 Unilateral primary osteoarthritis, right knee: Secondary | ICD-10-CM | POA: Diagnosis not present

## 2021-01-22 DIAGNOSIS — Z8616 Personal history of COVID-19: Secondary | ICD-10-CM | POA: Diagnosis not present

## 2021-01-22 HISTORY — DX: Sleep apnea, unspecified: G47.30

## 2021-01-22 HISTORY — PX: KNEE ARTHROSCOPY: SHX127

## 2021-01-22 LAB — GLUCOSE, CAPILLARY
Glucose-Capillary: 110 mg/dL — ABNORMAL HIGH (ref 70–99)
Glucose-Capillary: 134 mg/dL — ABNORMAL HIGH (ref 70–99)

## 2021-01-22 SURGERY — ARTHROSCOPY, KNEE
Anesthesia: Regional | Site: Knee | Laterality: Right

## 2021-01-22 MED ORDER — ACETAMINOPHEN 10 MG/ML IV SOLN
INTRAVENOUS | Status: AC
  Filled 2021-01-22: qty 100

## 2021-01-22 MED ORDER — FENTANYL CITRATE (PF) 100 MCG/2ML IJ SOLN
INTRAMUSCULAR | Status: AC
  Filled 2021-01-22: qty 2

## 2021-01-22 MED ORDER — OXYCODONE HCL 5 MG PO TABS
5.0000 mg | ORAL_TABLET | Freq: Once | ORAL | Status: AC | PRN
  Administered 2021-01-22: 5 mg via ORAL

## 2021-01-22 MED ORDER — ACETAMINOPHEN 10 MG/ML IV SOLN
1000.0000 mg | Freq: Once | INTRAVENOUS | Status: DC | PRN
  Administered 2021-01-22: 1000 mg via INTRAVENOUS

## 2021-01-22 MED ORDER — DEXTROSE 5 % IV SOLN
3.0000 g | INTRAVENOUS | Status: AC
  Administered 2021-01-22: 3 g via INTRAVENOUS
  Filled 2021-01-22: qty 3000

## 2021-01-22 MED ORDER — CEFAZOLIN SODIUM-DEXTROSE 2-4 GM/100ML-% IV SOLN
INTRAVENOUS | Status: AC
  Filled 2021-01-22: qty 100

## 2021-01-22 MED ORDER — PROPOFOL 10 MG/ML IV BOLUS
INTRAVENOUS | Status: DC | PRN
  Administered 2021-01-22: 200 mg via INTRAVENOUS
  Administered 2021-01-22 (×2): 50 mg via INTRAVENOUS

## 2021-01-22 MED ORDER — OXYCODONE HCL 5 MG PO TABS
ORAL_TABLET | ORAL | Status: AC
  Filled 2021-01-22: qty 1

## 2021-01-22 MED ORDER — BUPIVACAINE-EPINEPHRINE (PF) 0.5% -1:200000 IJ SOLN
INTRAMUSCULAR | Status: DC | PRN
  Administered 2021-01-22: 20 mL via PERINEURAL

## 2021-01-22 MED ORDER — FENTANYL CITRATE (PF) 100 MCG/2ML IJ SOLN
25.0000 ug | INTRAMUSCULAR | Status: DC | PRN
Start: 2021-01-22 — End: 2021-01-22
  Administered 2021-01-22 (×3): 50 ug via INTRAVENOUS

## 2021-01-22 MED ORDER — ACETAMINOPHEN 500 MG PO TABS: 1000.0000 mg | ORAL_TABLET | Freq: Once | ORAL | Status: DC | PRN

## 2021-01-22 MED ORDER — PROPOFOL 10 MG/ML IV BOLUS
INTRAVENOUS | Status: AC
  Filled 2021-01-22: qty 40

## 2021-01-22 MED ORDER — OXYCODONE HCL 5 MG/5ML PO SOLN
5.0000 mg | Freq: Once | ORAL | Status: AC | PRN
Start: 2021-01-22 — End: 2021-01-22

## 2021-01-22 MED ORDER — ACETAMINOPHEN 160 MG/5ML PO SOLN: 1000.0000 mg | Freq: Once | ORAL | Status: DC | PRN

## 2021-01-22 MED ORDER — LIDOCAINE 2% (20 MG/ML) 5 ML SYRINGE
INTRAMUSCULAR | Status: AC
  Filled 2021-01-22: qty 5

## 2021-01-22 MED ORDER — FENTANYL CITRATE (PF) 250 MCG/5ML IJ SOLN
INTRAMUSCULAR | Status: DC | PRN
  Administered 2021-01-22: 100 ug via INTRAVENOUS

## 2021-01-22 MED ORDER — CEFAZOLIN SODIUM-DEXTROSE 1-4 GM/50ML-% IV SOLN
INTRAVENOUS | Status: AC
  Filled 2021-01-22: qty 50

## 2021-01-22 MED ORDER — OXYCODONE-ACETAMINOPHEN 5-325 MG PO TABS: 1.0000 | ORAL_TABLET | ORAL | 0 refills | Status: DC | PRN

## 2021-01-22 MED ORDER — LACTATED RINGERS IV SOLN: INTRAVENOUS | Status: DC

## 2021-01-22 MED ORDER — DEXAMETHASONE SODIUM PHOSPHATE 10 MG/ML IJ SOLN
INTRAMUSCULAR | Status: DC | PRN
  Administered 2021-01-22: 4 mg via INTRAVENOUS

## 2021-01-22 MED ORDER — DEXMEDETOMIDINE HCL IN NACL 200 MCG/50ML IV SOLN
INTRAVENOUS | Status: DC | PRN
  Administered 2021-01-22: 16 ug via INTRAVENOUS

## 2021-01-22 MED ORDER — LIDOCAINE 2% (20 MG/ML) 5 ML SYRINGE
INTRAMUSCULAR | Status: DC | PRN
  Administered 2021-01-22: 40 mg via INTRAVENOUS

## 2021-01-22 SURGICAL SUPPLY — 27 items
BLADE EXCALIBUR 4.0X13 (MISCELLANEOUS) ×2 IMPLANT
BNDG COHESIVE 6X5 TAN STRL LF (GAUZE/BANDAGES/DRESSINGS) ×2 IMPLANT
COVER WAND RF STERILE (DRAPES) IMPLANT
DISSECTOR 4.0MM X 13CM (MISCELLANEOUS) IMPLANT
DRAPE ARTHROSCOPY W/POUCH 90 (DRAPES) ×2 IMPLANT
DRAPE U-SHAPE 47X51 STRL (DRAPES) ×2 IMPLANT
DRSG EMULSION OIL 3X3 NADH (GAUZE/BANDAGES/DRESSINGS) ×2 IMPLANT
DURAPREP 26ML APPLICATOR (WOUND CARE) ×2 IMPLANT
EXCALIBUR 3.8MM X 13CM (MISCELLANEOUS) IMPLANT
GAUZE SPONGE 4X4 12PLY STRL (GAUZE/BANDAGES/DRESSINGS) ×2 IMPLANT
GLOVE SURG ORTHO LTX SZ9 (GLOVE) ×2 IMPLANT
GLOVE SURG UNDER POLY LF SZ9 (GLOVE) ×2 IMPLANT
GOWN STRL REUS W/ TWL LRG LVL3 (GOWN DISPOSABLE) ×1 IMPLANT
GOWN STRL REUS W/ TWL XL LVL3 (GOWN DISPOSABLE) ×1 IMPLANT
GOWN STRL REUS W/TWL LRG LVL3 (GOWN DISPOSABLE) ×2
GOWN STRL REUS W/TWL XL LVL3 (GOWN DISPOSABLE) ×2
MANIFOLD NEPTUNE II (INSTRUMENTS) IMPLANT
NDL SAFETY ECLIPSE 18X1.5 (NEEDLE) ×1 IMPLANT
NEEDLE HYPO 18GX1.5 SHARP (NEEDLE) ×2
PACK ARTHROSCOPY DSU (CUSTOM PROCEDURE TRAY) ×2 IMPLANT
PACK BASIN DAY SURGERY FS (CUSTOM PROCEDURE TRAY) ×2 IMPLANT
PORT APPOLLO RF 90DEGREE MULTI (SURGICAL WAND) IMPLANT
PROBE APOLLO 90XL (SURGICAL WAND) IMPLANT
SUT ETHILON 2 0 FSLX (SUTURE) ×2 IMPLANT
TOWEL GREEN STERILE FF (TOWEL DISPOSABLE) ×2 IMPLANT
TUBING ARTHROSCOPY IRRIG 16FT (MISCELLANEOUS) ×2 IMPLANT
WRAP KNEE MAXI GEL POST OP (GAUZE/BANDAGES/DRESSINGS) IMPLANT

## 2021-01-22 NOTE — Anesthesia Procedure Notes (Addendum)
Anesthesia Regional Block: Adductor canal block   Pre-Anesthetic Checklist: ,, timeout performed, Correct Patient, Correct Site, Correct Laterality, Correct Procedure, Correct Position, site marked, Risks and benefits discussed,  Surgical consent,  Pre-op evaluation,  At surgeon's request and post-op pain management  Laterality: Right and Lower  Prep: chloraprep       Needles:  Injection technique: Single-shot     Needle Length: 9cm  Needle Gauge: 22     Additional Needles: Arrow StimuQuik ECHO Echogenic Stimulating PNB Needle  Procedures:,,,, ultrasound used (permanent image in chart),,,,  Narrative:  Start time: 01/22/2021 8:50 AM End time: 01/22/2021 8:53 AM Injection made incrementally with aspirations every 5 mL.  Performed by: Personally  Anesthesiologist: Val Eagle, MD

## 2021-01-22 NOTE — Op Note (Signed)
01/22/2021  9:35 AM  PATIENT:  Derrick Barnes    PRE-OPERATIVE DIAGNOSIS:  Medical Meniscus Tear Right Knee  POST-OPERATIVE DIAGNOSIS:  Same  PROCEDURE:  RIGHT KNEE ARTHROSCOPY AND DEBRIDEMENT  SURGEON:  Nadara Mustard, MD  PHYSICIAN ASSISTANT:None ANESTHESIA:   General  PREOPERATIVE INDICATIONS:  Derrick Barnes is a  35 y.o. male with a diagnosis of Medical Meniscus Tear Right Knee who failed conservative measures and elected for surgical management.    The risks benefits and alternatives were discussed with the patient preoperatively including but not limited to the risks of infection, bleeding, nerve injury, cardiopulmonary complications, the need for revision surgery, among others, and the patient was willing to proceed.  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  OPERATIVE PROCEDURE: Patient was brought the operating room and underwent a general anesthetic.  After adequate levels anesthesia were obtained patient underwent a femoral block by anesthesia the right lower extremity was then prepped using DuraPrep draped into a sterile field a timeout was called.  The scope was inserted to the anterior lateral portal and an anterior medial working portal established.  Patient had a hemarthrosis and extensive synovitis.  A synovectomy was performed anteriorly to visualize the anterior joint line.  Electrocautery was used hemostasis.  With a valgus stress the posterior medial joint line was evaluated the meniscus was intact but shortened.  The margins were probed and stable.  Patient had a large osteochondral defect the medial femoral condyle this was debrided back to bleeding viable subchondral bone the margins were stable after debridement.  The osteochondral defect was approximately 2 cm in diameter.  Examination the notch showed an intact ACL.  Examination the lateral joint line  figure-of-four position showed intact cartilage of the femur and tibia with an intact lateral meniscus.  With the knee extended the remainder of the synovectomy was performed patient had a large plica that was excised medially.  There is significant mount of synovial ingrowth into the gutters and anterior joint line.  This was resected and electrocautery was used hemostasis a survey of all compartments was then again performed there were no loose bodies.  The instruments were removed the portals were closed using 2-0 nylon a sterile dressing was applied patient was extubated taken the PACU in stable condition   DISCHARGE PLANNING:  Antibiotic duration: Preoperative antibiotics  Weightbearing: Weightbearing as tolerated.  Pain medication: Prescription for Percocet  Dressing care/ Wound VAC: Discontinue dressing in 2 days  Ambulatory devices: Crutches  Discharge to: Home  Follow-up: In the office 1 week post operative.

## 2021-01-22 NOTE — Anesthesia Procedure Notes (Signed)
Procedure Name: LMA Insertion Date/Time: 01/22/2021 8:52 AM Performed by: Lucinda Dell, CRNA Pre-anesthesia Checklist: Patient identified, Suction available, Emergency Drugs available and Patient being monitored Patient Re-evaluated:Patient Re-evaluated prior to induction Oxygen Delivery Method: Circle system utilized Preoxygenation: Pre-oxygenation with 100% oxygen Induction Type: IV induction Ventilation: Mask ventilation without difficulty LMA: LMA inserted LMA Size: 5.0 Number of attempts: 1 Placement Confirmation: positive ETCO2 and breath sounds checked- equal and bilateral Tube secured with: Tape Dental Injury: Teeth and Oropharynx as per pre-operative assessment

## 2021-01-22 NOTE — Transfer of Care (Signed)
Immediate Anesthesia Transfer of Care Note  Patient: Derrick Barnes  Procedure(s) Performed: RIGHT KNEE ARTHROSCOPY AND DEBRIDEMENT (Right Knee)  Patient Location: PACU  Anesthesia Type:GA combined with regional for post-op pain  Level of Consciousness: sedated and responds to stimulation  Airway & Oxygen Therapy: Patient Spontanous Breathing and Patient connected to face mask oxygen  Post-op Assessment: Report given to RN and Post -op Vital signs reviewed and stable  Post vital signs: Reviewed and stable  Last Vitals:  Vitals Value Taken Time  BP    Temp    Pulse 66 01/22/21 0936  Resp 13 01/22/21 0936  SpO2 95 % 01/22/21 0936    Last Pain:  Vitals:   01/22/21 0705  TempSrc: Oral  PainSc: 8       Patients Stated Pain Goal: 5 (01/22/21 0705)  Complications: No complications documented.

## 2021-01-22 NOTE — Discharge Instructions (Signed)
  Post Anesthesia Home Care Instructions  Activity: Get plenty of rest for the remainder of the day. A responsible individual must stay with you for 24 hours following the procedure.  For the next 24 hours, DO NOT: -Drive a car -Advertising copywriter -Drink alcoholic beverages -Take any medication unless instructed by your physician -Make any legal decisions or sign important papers.  Meals: Start with liquid foods such as gelatin or soup. Progress to regular foods as tolerated. Avoid greasy, spicy, heavy foods. If nausea and/or vomiting occur, drink only clear liquids until the nausea and/or vomiting subsides. Call your physician if vomiting continues.  Special Instructions/Symptoms: Your throat may feel dry or sore from the anesthesia or the breathing tube placed in your throat during surgery. If this causes discomfort, gargle with warm salt water. The discomfort should disappear within 24 hours.  If you had a scopolamine patch placed behind your ear for the management of post- operative nausea and/or vomiting:  1. The medication in the patch is effective for 72 hours, after which it should be removed.  Wrap patch in a tissue and discard in the trash. Wash hands thoroughly with soap and water. 2. You may remove the patch earlier than 72 hours if you experience unpleasant side effects which may include dry mouth, dizziness or visual disturbances. 3. Avoid touching the patch. Wash your hands with soap and water after contact with the patch.    Regional Anesthesia Blocks  1. Numbness or the inability to move the "blocked" extremity may last from 3-48 hours after placement. The length of time depends on the medication injected and your individual response to the medication. If the numbness is not going away after 48 hours, call your surgeon.  2. The extremity that is blocked will need to be protected until the numbness is gone and the  Strength has returned. Because you cannot feel it, you will  need to take extra care to avoid injury. Because it may be weak, you may have difficulty moving it or using it. You may not know what position it is in without looking at it while the block is in effect.  3. For blocks in the legs and feet, returning to weight bearing and walking needs to be done carefully. You will need to wait until the numbness is entirely gone and the strength has returned. You should be able to move your leg and foot normally before you try and bear weight or walk. You will need someone to be with you when you first try to ensure you do not fall and possibly risk injury.  4. Bruising and tenderness at the needle site are common side effects and will resolve in a few days.  5. Persistent numbness or new problems with movement should be communicated to the surgeon or the [facility] [phone] [facility] [phone]).  May have Tylenol at 4pm today 01/22/21. May have Percocet after 4:52PM today, 01/22/21.

## 2021-01-22 NOTE — H&P (Signed)
Zacarias Krauter is an 35 y.o. male.   Chief Complaint: Right Knee pain HPI: This is a pleasant 35 year old gentleman with a 62-month history of right knee pain swelling and instability.  He states he had a fall when he was living in [place] in February of this year.  He had pain and swelling.  He was seen by a provider who told him he had a small fracture of his tibia.  He still had some difficulties but got somewhat better.  Approximately 5 weeks ago he jumped off a porch landing awkwardly on his knee.  Since then he has had increased global pain and swelling.  He states that his knee gives way and he falls because of this.  He also has a sense of locking and catching.  He does think he appreciated a pop when he sustained his second injury it hurts him to walk and he works as a Financial planner  Past Medical History:  Diagnosis Date   Diabetes mellitus without complication (HCC)    History of COVID-19 11/21/2020   Hypertension    Sleep apnea     Past Surgical History:  Procedure Laterality Date   ABDOMINAL SURGERY     HERNIA REPAIR      History reviewed. No pertinent family history. Social History:  reports that he has been smoking cigarettes. He has been smoking about 0.25 packs per day. He has never used smokeless tobacco. He reports previous alcohol use. He reports previous drug use.  Allergies:  Allergies  Allergen Reactions   Alfalfa    Trulicity [Dulaglutide]     Medications Prior to Admission  Medication Sig Dispense Refill   ibuprofen (ADVIL) 800 MG tablet Take 800 mg by mouth every 8 (eight) hours as needed.     metFORMIN (GLUCOPHAGE) 500 MG tablet Take by mouth 2 (two) times daily with a meal.      No results found for this or any previous visit (from the past 48 hour(s)). No results found.  Review of Systems  All other systems reviewed and are negative.   Height 6\' 4"  (1.93 m), weight (!) 154.2 kg. Physical Exam  Patient is alert, oriented, no  adenopathy, well-dressed, normal affect, normal respiratory effort. Right knee: Moderate soft tissue swelling but no frank effusion.  Tender around the medial lateral joint lines.  Has some increased translation with anterior draw pain with terminal extension and flexion.  No cellulitis heart RRR Lungs Clear Assessment/Plan 1. Chronic pain of right knee   2. Knee instability, right     Plan: Patient has had ongoing knee pain and instability more accentuated since June.  He has locking and catching and popping.  He has an antalgic gait and has fallen secondary to the instability.  I recommend a MRI with follow-up afterwards.  I did offer him an injection into his knee today he declined this. MRI showed complete radial tear medial meniscus. Will go forward with Knee Arthroscopy   July Del Wiseman, PA 01/22/2021, 6:27 AM

## 2021-01-22 NOTE — Anesthesia Preprocedure Evaluation (Addendum)
Anesthesia Evaluation  Patient identified by MRN, date of birth, ID band Patient awake    Reviewed: Allergy & Precautions, NPO status , Patient's Chart, lab work & pertinent test results  History of Anesthesia Complications Negative for: history of anesthetic complications  Airway Mallampati: II  TM Distance: >3 FB Neck ROM: Full    Dental  (+) Edentulous Upper, Poor Dentition, Missing, Loose,    Pulmonary sleep apnea , Recent URI , Resolved, Current Smoker and Patient abstained from smoking.,  Covid-19 Nucleic Acid Test Results Lab Results      Component                Value               Date                      SARSCOV2NAA              POSITIVE (A)        11/21/2020              breath sounds clear to auscultation       Cardiovascular hypertension,  Rhythm:Regular     Neuro/Psych negative neurological ROS  negative psych ROS   GI/Hepatic negative GI ROS, Neg liver ROS,   Endo/Other  diabetesMorbid obesity  Renal/GU negative Renal ROS     Musculoskeletal Medical Meniscus Tear Right Knee   Abdominal   Peds  Hematology negative hematology ROS (+)   Anesthesia Other Findings   Reproductive/Obstetrics                           Anesthesia Physical Anesthesia Plan  ASA: III  Anesthesia Plan: General and Regional   Post-op Pain Management:  Regional for Post-op pain   Induction: Intravenous  PONV Risk Score and Plan: 1 and Ondansetron and Dexamethasone  Airway Management Planned: LMA and Oral ETT  Additional Equipment: None  Intra-op Plan:   Post-operative Plan: Extubation in OR  Informed Consent: I have reviewed the patients History and Physical, chart, labs and discussed the procedure including the risks, benefits and alternatives for the proposed anesthesia with the patient or authorized representative who has indicated his/her understanding and acceptance.     Dental  advisory given  Plan Discussed with: CRNA and Surgeon  Anesthesia Plan Comments:         Anesthesia Quick Evaluation

## 2021-01-22 NOTE — Interval H&P Note (Signed)
History and Physical Interval Note:  01/22/2021 7:04 AM  Derrick Barnes  has presented today for surgery, with the diagnosis of Medical Meniscus Tear Right Knee.  The various methods of treatment have been discussed with the patient and family. After consideration of risks, benefits and other options for treatment, the patient has consented to  Procedure(s): RIGHT KNEE ARTHROSCOPY AND DEBRIDEMENT (Right) as a surgical intervention.  The patient's history has been reviewed, patient examined, no change in status, stable for surgery.  I have reviewed the patient's chart and labs.  Questions were answered to the patient's satisfaction.     Nadara Mustard

## 2021-01-23 ENCOUNTER — Encounter (HOSPITAL_BASED_OUTPATIENT_CLINIC_OR_DEPARTMENT_OTHER): Payer: Self-pay | Admitting: Orthopedic Surgery

## 2021-01-28 NOTE — Anesthesia Postprocedure Evaluation (Signed)
Anesthesia Post Note  Patient: Derrick Barnes  Procedure(s) Performed: RIGHT KNEE ARTHROSCOPY AND DEBRIDEMENT (Right Knee)     Anesthesia Post Evaluation No complications documented.  Last Vitals:  Vitals:   01/22/21 1025 01/22/21 1104  BP:  (!) 105/56  Pulse: (!) 57 66  Resp: (!) 9 13  Temp:  36.5 C  SpO2: 99% 99%    Last Pain:  Vitals:   01/23/21 1028  TempSrc:   PainSc: 5                  Kylan Liberati

## 2021-01-28 NOTE — Anesthesia Postprocedure Evaluation (Signed)
Anesthesia Post Note  Patient: Derrick Barnes  Procedure(s) Performed: RIGHT KNEE ARTHROSCOPY AND DEBRIDEMENT (Right Knee)     Patient location during evaluation: PACU Anesthesia Type: Regional and General Level of consciousness: patient cooperative and awake Pain management: pain level controlled Vital Signs Assessment: post-procedure vital signs reviewed and stable Respiratory status: spontaneous breathing, nonlabored ventilation, respiratory function stable and patient connected to nasal cannula oxygen Cardiovascular status: blood pressure returned to baseline and stable Postop Assessment: no apparent nausea or vomiting Anesthetic complications: no   No complications documented.  Last Vitals:  Vitals:   01/22/21 1025 01/22/21 1104  BP:  (!) 105/56  Pulse: (!) 57 66  Resp: (!) 9 13  Temp:  36.5 C  SpO2: 99% 99%    Last Pain:  Vitals:   01/23/21 1028  TempSrc:   PainSc: 5                  Viraj Liby

## 2021-02-03 ENCOUNTER — Encounter (HOSPITAL_COMMUNITY): Payer: Self-pay | Admitting: Emergency Medicine

## 2021-02-03 ENCOUNTER — Other Ambulatory Visit: Payer: Self-pay

## 2021-02-03 ENCOUNTER — Emergency Department (HOSPITAL_COMMUNITY): Payer: Medicaid Other

## 2021-02-03 ENCOUNTER — Emergency Department (HOSPITAL_COMMUNITY)
Admission: EM | Admit: 2021-02-03 | Discharge: 2021-02-03 | Disposition: A | Discharge status: Home / Self Care | Payer: Medicaid Other | Attending: Emergency Medicine | Admitting: Emergency Medicine

## 2021-02-03 DIAGNOSIS — W01198A Fall on same level from slipping, tripping and stumbling with subsequent striking against other object, initial encounter: Secondary | ICD-10-CM | POA: Diagnosis not present

## 2021-02-03 DIAGNOSIS — I10 Essential (primary) hypertension: Secondary | ICD-10-CM | POA: Diagnosis not present

## 2021-02-03 DIAGNOSIS — E119 Type 2 diabetes mellitus without complications: Secondary | ICD-10-CM | POA: Insufficient documentation

## 2021-02-03 DIAGNOSIS — M25641 Stiffness of right hand, not elsewhere classified: Secondary | ICD-10-CM | POA: Diagnosis not present

## 2021-02-03 DIAGNOSIS — Y9289 Other specified places as the place of occurrence of the external cause: Secondary | ICD-10-CM | POA: Insufficient documentation

## 2021-02-03 DIAGNOSIS — Z7984 Long term (current) use of oral hypoglycemic drugs: Secondary | ICD-10-CM | POA: Insufficient documentation

## 2021-02-03 DIAGNOSIS — Z8616 Personal history of COVID-19: Secondary | ICD-10-CM | POA: Insufficient documentation

## 2021-02-03 DIAGNOSIS — F17210 Nicotine dependence, cigarettes, uncomplicated: Secondary | ICD-10-CM | POA: Insufficient documentation

## 2021-02-03 DIAGNOSIS — S8001XA Contusion of right knee, initial encounter: Secondary | ICD-10-CM | POA: Diagnosis not present

## 2021-02-03 DIAGNOSIS — M25461 Effusion, right knee: Secondary | ICD-10-CM

## 2021-02-03 DIAGNOSIS — S80911A Unspecified superficial injury of right knee, initial encounter: Secondary | ICD-10-CM | POA: Diagnosis present

## 2021-02-03 MED ORDER — MELOXICAM 7.5 MG PO TABS: 7.5000 mg | ORAL_TABLET | Freq: Two times a day (BID) | ORAL | 0 refills | Status: AC | PRN

## 2021-02-03 MED ORDER — OXYCODONE-ACETAMINOPHEN 5-325 MG PO TABS
2.0000 | ORAL_TABLET | Freq: Once | ORAL | Status: AC
Start: 2021-02-03 — End: 2021-02-03
  Administered 2021-02-03: 2 via ORAL
  Filled 2021-02-03: qty 2

## 2021-02-03 NOTE — ED Provider Notes (Signed)
[facility] EMERGENCY DEPARTMENT Provider Note   CSN: [id number] Arrival date & time: 02/03/21  1741     History Chief Complaint  Patient presents with  . Leg Pain    Derrick Barnes is a 35 y.o. male.  HPI    This patient is a 35 year old male, he has a history of hypertension and sleep apnea, he underwent surgery of his right knee on March 15, approximately 12 days ago, he had right knee arthroscopy with debridement, he was noted to have a medial meniscus tear of the right knee and had failed conservative measures.  The patient reports that approximately 3 hours after returning home he had allowed his right knee to buckle, he fell to the ground striking his lateral knee on the ground, he has continued to walk on it since that time using his cane but is concerned because there has been bruising around the knee and is extending down into the lower extremity towards the ankle and the foot.  There was no injury to the foot or the ankle.  He has not had any fevers, he has been able to eat and drink normally though he has had a slight decreased appetite today.  He has run out of the Percocet medication that he was given thus he is taking ibuprofen but this does not relieve his pain.  There is no other joint arthropathies, there is no drainage or discharge from the wounds and he denies any redness over the surgical sites.  He does not yet have a follow-up scheduled for suture removal but was asked to call the office tomorrow morning to get this arranged.  Past Medical History:  Diagnosis Date  . Diabetes mellitus without complication (HCC)   . History of COVID-19 11/21/2020  . Hypertension   . Sleep apnea     Patient Active Problem List   Diagnosis Date Noted  . Unilateral primary osteoarthritis, right knee     Past Surgical History:  Procedure Laterality Date  . ABDOMINAL SURGERY    . HERNIA REPAIR    . KNEE ARTHROSCOPY Right 01/22/2021   Procedure: RIGHT KNEE ARTHROSCOPY AND  DEBRIDEMENT;  Surgeon: Nadara Mustard, MD;  Location: [facility];  Service: Orthopedics;  Laterality: Right;       History reviewed. No pertinent family history.  Social History   Tobacco Use  . Smoking status: Current Every Day Smoker    Packs/day: 0.25    Types: Cigarettes  . Smokeless tobacco: Never Used  Vaping Use  . Vaping Use: Never used  Substance Use Topics  . Alcohol use: Not Currently  . Drug use: Not Currently    Home Medications Prior to Admission medications   Medication Sig Start Date End Date Taking? Authorizing Provider  meloxicam (MOBIC) 7.5 MG tablet Take 1 tablet (7.5 mg total) by mouth 2 (two) times daily as needed for up to 14 days for pain. 02/03/21 02/17/21 Yes Eber Hong, MD  ibuprofen (ADVIL) 800 MG tablet Take 800 mg by mouth every 8 (eight) hours as needed.    [provider]  metFORMIN (GLUCOPHAGE) 500 MG tablet Take by mouth 2 (two) times daily with a meal.    [provider]  oxyCODONE-acetaminophen (PERCOCET) 5-325 MG tablet Take 1 tablet by mouth every 4 (four) hours as needed for severe pain. 01/22/21 01/22/22  Persons, [place], PA    Allergies    Alfalfa and Trulicity [dulaglutide]  Review of Systems   Review of Systems  Constitutional: Negative for fever.  Musculoskeletal: Positive for joint swelling.  Skin: Positive for color change.  Neurological: Negative for weakness and numbness.    Physical Exam Updated Vital Signs BP (!) 146/91   Pulse 80   Temp 97.8 F (36.6 C) (Oral)   Resp 19   SpO2 99%   Physical Exam Constitutional:      General: He is not in acute distress.    Appearance: He is well-developed. He is not diaphoretic.  HENT:     Head: Normocephalic.  Eyes:     General: No scleral icterus.    Conjunctiva/sclera: Conjunctivae normal.  Cardiovascular:     Rate and Rhythm: Normal rate and regular rhythm.  Pulmonary:     Effort: Pulmonary effort is normal.     Breath sounds:  Normal breath sounds.  Musculoskeletal:        General: Tenderness ( ttp over the ecchymotic areas of the lower extremity) present. Normal range of motion.     Comments: There is a clinical joint effusion of the right knee compared to the left, there is no redness or warmth or induration, there is no overlying rash or redness, the sutured site looks clean, the patient is able to passively range the knee to approximately 90 degrees, he does not have any significant hesitancy to do this though he states it does hurt.  He is able to actively range his knee but states that that does hurt as well.  It appears fairly supple compared to his left side.  There does appear to be some ecchymotic bruising extending from the right knee medially down towards the ankle and some ecchymosis of the foot as well but he is able to fully range his ankle and foot  Skin:    General: Skin is warm and dry.     Findings: Bruising present.  Neurological:     Mental Status: He is alert.     Coordination: Coordination normal.     Comments: Sensation and motor intact     ED Results / Procedures / Treatments   Labs (all labs ordered are listed, but only abnormal results are displayed) Labs Reviewed - No data to display  EKG None  Radiology DG Knee Complete 4 Views Right  Result Date: 02/03/2021 CLINICAL DATA:  Right knee pain after fall EXAM: RIGHT KNEE - COMPLETE 4+ VIEW COMPARISON:  07/05/2020 FINDINGS: No evidence of acute fracture. No malalignment. Large knee joint effusion without fat-fluid level. Small medial compartment marginal osteophytes. Joint spaces are maintained. IMPRESSION: 1. No acute osseous abnormality. 2. Large knee joint effusion. 3. Early medial compartment degenerative changes. Electronically Signed   By: Duanne Guess D.O.   On: 02/03/2021 19:10    Procedures Procedures   Medications Ordered in ED Medications  oxyCODONE-acetaminophen (PERCOCET/ROXICET) 5-325 MG per tablet 2 tablet (2  tablets Oral Given 02/03/21 1818)    ED Course  I have reviewed the triage vital signs and the nursing notes.  Pertinent labs & imaging results that were available during my care of the patient were reviewed by me and considered in my medical decision making (see chart for details).    MDM Rules/Calculators/A&P                          We will obtain imaging of the right knee to make sure there has been no postoperative injuries, ultimately I think the patient is suffering from the effects of gravity and ecchymosis,  there may have been other internal complications with the knee but at this time with the effusion and the level of pain he can follow-up in the outpatient setting as there is no fever or signs of infection.  The patient is not tachycardic on my exam, his blood pressure is improved, he has been encouraged to take his home medications.  To pain medications have been ordered for him here, no opiates will be prescribed - can have f/u with Ortho this week  X-rays confirm that the patient does not fact have a large joint effusion which is not surprising, I suspect it is a hemarthrosis given the ecchymosis on his leg.  Otherwise the patient is stable, anti-inflammatories prescribed, can follow-up in the outpatient setting.  Final Clinical Impression(s) / ED Diagnoses Final diagnoses:  Knee effusion, right    Rx / DC Orders ED Discharge Orders         Ordered    meloxicam (MOBIC) 7.5 MG tablet  2 times daily PRN        02/03/21 2021           Eber Hong, MD 02/03/21 2023

## 2021-02-03 NOTE — Discharge Instructions (Signed)
Your x-rays show that you have a joint effusion which means you have some fluid which is likely blood inside your joint.  This is what is causing the bruising of your leg, it will gradually get better, you need to keep your leg elevated, keep an Ace wrap around your knee for some compression and take Mobic twice a day as needed for pain.  Please see your orthopedic surgeon this week.  ER for increasing pain or fever

## 2021-02-03 NOTE — ED Triage Notes (Signed)
Pt to the ED with leg pain after having knee surgery on 01/22/21.  Pt states he fell 3 hours after surgery.

## 2021-02-03 NOTE — ED Notes (Signed)
Pt wheeled out to lobby.  

## 2021-02-06 ENCOUNTER — Encounter: Payer: Self-pay | Admitting: Family

## 2021-02-06 ENCOUNTER — Ambulatory Visit (INDEPENDENT_AMBULATORY_CARE_PROVIDER_SITE_OTHER): Payer: Medicaid Other | Admitting: Family

## 2021-02-06 DIAGNOSIS — M25461 Effusion, right knee: Secondary | ICD-10-CM | POA: Diagnosis not present

## 2021-02-06 DIAGNOSIS — M1711 Unilateral primary osteoarthritis, right knee: Secondary | ICD-10-CM

## 2021-02-06 MED ORDER — LIDOCAINE HCL 1 % IJ SOLN
1.0000 mL | INTRAMUSCULAR | Status: AC | PRN
  Administered 2021-02-06: 1 mL

## 2021-02-06 NOTE — Progress Notes (Addendum)
Office Visit Note   Patient: Derrick Barnes           Date of Birth: 05/29/1986           MRN: [id number] Visit Date: 02/06/2021              Requested by: Kathalene Frames [facility] [address] [place],  [place] [address] PCP: Ponciano Ort, [facility]  No chief complaint on file.     HPI: The patient is a 35 year old gentleman seen today 2 weeks status post right knee arthroscopy. Unfortunately he had a fall the same day as his surgery onto the right knee. Had worsening of his swelling and pain. Difficulty with flexion.  Did have an ED visit for this.  Assessment & Plan: Visit Diagnoses:  1. Unilateral primary osteoarthritis, right knee     Plan: tolerated aspiration today well. Voiced immediate relief of pain and tightness. Sutures harvested. Will follow up in office in 2 weeks.  Follow-Up Instructions: Return in about 2 weeks (around 02/20/2021).   Ortho Exam  Patient is alert, oriented, no adenopathy, well-dressed, normal affect, normal respiratory effort. Portal well healed. Suture in place. Significant effusion. No erythema or warmth.  Imaging: No results found. No images are attached to the encounter.  Labs: No results found for: HGBA1C, ESRSEDRATE, CRP, LABURIC, REPTSTATUS, GRAMSTAIN, CULT, LABORGA   No results found for: ALBUMIN, PREALBUMIN, CBC  No results found for: MG No results found for: VD25OH  No results found for: PREALBUMIN No flowsheet data found.   There is no height or weight on file to calculate BMI.  Orders:  No orders of the defined types were placed in this encounter.  No orders of the defined types were placed in this encounter.    Procedures: Large Joint Inj: R knee on 02/06/2021 9:32 AM Indications: pain Details: 18 G 1.5 in needle, anteromedial approach Medications: 1 mL lidocaine 1 % Aspirate: 110 mL serous and blood-tinged Consent was given by the patient.      Clinical Data: No additional findings.  ROS:  All other  systems negative, except as noted in the HPI. Review of Systems  Constitutional: Negative for chills and fever.  Musculoskeletal: Positive for arthralgias and joint swelling.    Objective: Vital Signs: There were no vitals taken for this visit.  Specialty Comments:  No specialty comments available.  PMFS History: Patient Active Problem List   Diagnosis Date Noted  . Unilateral primary osteoarthritis, right knee    Past Medical History:  Diagnosis Date  . Diabetes mellitus without complication (HCC)   . History of COVID-19 11/21/2020  . Hypertension   . Sleep apnea     History reviewed. No pertinent family history.  Past Surgical History:  Procedure Laterality Date  . ABDOMINAL SURGERY    . HERNIA REPAIR    . KNEE ARTHROSCOPY Right 01/22/2021   Procedure: RIGHT KNEE ARTHROSCOPY AND DEBRIDEMENT;  Surgeon: Nadara Mustard, MD;  Location: [facility];  Service: Orthopedics;  Laterality: Right;   Social History   Occupational History  . Not on file  Tobacco Use  . Smoking status: Current Every Day Smoker    Packs/day: 0.25    Types: Cigarettes  . Smokeless tobacco: Never Used  Vaping Use  . Vaping Use: Never used  Substance and Sexual Activity  . Alcohol use: Not Currently  . Drug use: Not Currently  . Sexual activity: Not on file

## 2021-02-20 ENCOUNTER — Ambulatory Visit: Payer: Medicaid Other | Admitting: Family

## 2021-02-26 ENCOUNTER — Encounter: Payer: Self-pay | Admitting: Family

## 2021-02-26 ENCOUNTER — Ambulatory Visit (INDEPENDENT_AMBULATORY_CARE_PROVIDER_SITE_OTHER): Payer: Medicaid Other | Admitting: Family

## 2021-02-26 DIAGNOSIS — M1711 Unilateral primary osteoarthritis, right knee: Secondary | ICD-10-CM

## 2021-02-26 MED ORDER — OXYCODONE-ACETAMINOPHEN 5-325 MG PO TABS
1.0000 | ORAL_TABLET | Freq: Three times a day (TID) | ORAL | 0 refills | Status: DC | PRN
Start: 2021-02-26 — End: 2021-04-11

## 2021-02-26 MED ORDER — DICLOFENAC SODIUM 75 MG PO TBEC: 75.0000 mg | DELAYED_RELEASE_TABLET | Freq: Two times a day (BID) | ORAL | 2 refills | Status: DC

## 2021-02-26 NOTE — Progress Notes (Signed)
Office Visit Note   Patient: Derrick Barnes           Date of Birth: October 07, 1986           MRN: [id number] Visit Date: 02/26/2021              Requested by: Kathalene Frames [facility] [address] [place],  [place] [address] PCP: Ponciano Ort, [facility]  Chief Complaint  Patient presents with  . Right Knee - Routine Post Op    01/22/21 right knee scope and debridement  02/06/21 s/p injection       HPI: The patient is a 35 year old gentleman who is seen status post right knee arthroscopy with debridement on March 15.  Unfortunately he has continued to have pain he feels that the surgery has not been helpful for him ultimately.    Complains of swelling. pain with flexion.  Feels he needs to use straight cane to aid with his mobility he feels that the knee locks he is fears falling.    At last postoperative follow-up he did have aspiration of the right knee he is hesitant to have any steroid injection.  States he has been taking 800 mg of ibuprofen 4 times a day without relief  Assessment & Plan: Visit Diagnoses:  1. Unilateral primary osteoarthritis, right knee     Plan: Declined repeat aspiration today.  Declined Depo-Medrol injection as well.  Declined physical therapy referral.  We will provide anti-inflammatory prescription as well as 1 more refill of his narcotic.  We will follow-up once more in 4 weeks discussed home exercises he can do.   Discussed natural course of healing he will follow-up with Dr. Lajoyce Corners.  Follow-Up Instructions: Return in about 4 weeks (around 03/26/2021), or c duda.   Right Knee Exam   Tenderness  The patient is experiencing tenderness in the medial joint line (global).  Range of Motion  The patient has normal right knee ROM.      Patient is alert, oriented, no adenopathy, well-dressed, normal affect, normal respiratory effort. Portals are well healed.  Moderate effusion. No erythema or warmth.  Imaging: No results found. No images are  attached to the encounter.  Labs: No results found for: HGBA1C, ESRSEDRATE, CRP, LABURIC, REPTSTATUS, GRAMSTAIN, CULT, LABORGA   No results found for: ALBUMIN, PREALBUMIN, CBC  No results found for: MG No results found for: VD25OH  No results found for: PREALBUMIN No flowsheet data found.   There is no height or weight on file to calculate BMI.  Orders:  No orders of the defined types were placed in this encounter.  No orders of the defined types were placed in this encounter.    Procedures: No procedures performed  Clinical Data: No additional findings.  ROS:  All other systems negative, except as noted in the HPI. Review of Systems  Constitutional: Negative for chills and fever.  Musculoskeletal: Positive for arthralgias and joint swelling.    Objective: Vital Signs: There were no vitals taken for this visit.  Specialty Comments:  No specialty comments available.  PMFS History: Patient Active Problem List   Diagnosis Date Noted  . Unilateral primary osteoarthritis, right knee    Past Medical History:  Diagnosis Date  . Diabetes mellitus without complication (HCC)   . History of COVID-19 11/21/2020  . Hypertension   . Sleep apnea     History reviewed. No pertinent family history.  Past Surgical History:  Procedure Laterality Date  . ABDOMINAL SURGERY    .  HERNIA REPAIR    . KNEE ARTHROSCOPY Right 01/22/2021   Procedure: RIGHT KNEE ARTHROSCOPY AND DEBRIDEMENT;  Surgeon: Nadara Mustard, MD;  Location: [facility];  Service: Orthopedics;  Laterality: Right;   Social History   Occupational History  . Not on file  Tobacco Use  . Smoking status: Current Every Day Smoker    Packs/day: 0.25    Types: Cigarettes  . Smokeless tobacco: Never Used  Vaping Use  . Vaping Use: Never used  Substance and Sexual Activity  . Alcohol use: Not Currently  . Drug use: Not Currently  . Sexual activity: Not on file

## 2021-03-05 ENCOUNTER — Telehealth: Payer: Self-pay | Admitting: Orthopedic Surgery

## 2021-03-05 NOTE — Telephone Encounter (Signed)
Patient called requesting a call back from Autumn F. Patient states PA Erin sent a prescription into pharmacy and he states he can not afford the co pay. Please call patient about this matter at [phone].

## 2021-03-05 NOTE — Telephone Encounter (Signed)
I called and lm on vm to advise that pt that if it is the volaren that she gave rx for that he could try OTC diclofenac gel this is OTC does not need rx and he can follow package instructions and apply topically to the affected area and this will absorb through the skin. To call with any questions.

## 2021-03-26 ENCOUNTER — Ambulatory Visit (INDEPENDENT_AMBULATORY_CARE_PROVIDER_SITE_OTHER): Payer: Medicaid Other

## 2021-03-26 ENCOUNTER — Encounter: Payer: Self-pay | Admitting: Family

## 2021-03-26 ENCOUNTER — Other Ambulatory Visit: Payer: Self-pay

## 2021-03-26 ENCOUNTER — Ambulatory Visit (INDEPENDENT_AMBULATORY_CARE_PROVIDER_SITE_OTHER): Payer: Medicaid Other | Admitting: Family

## 2021-03-26 DIAGNOSIS — M5416 Radiculopathy, lumbar region: Secondary | ICD-10-CM

## 2021-03-26 MED ORDER — PREDNISONE 50 MG PO TABS: ORAL_TABLET | ORAL | 0 refills | Status: DC

## 2021-03-26 NOTE — Progress Notes (Signed)
Office Visit Note   Patient: Derrick Barnes           Date of Birth: Mar 21, 1986           MRN: [id number] Visit Date: 03/26/2021              Requested by: Kathalene Frames [facility] [address] [place],  [place] [address] PCP: Ponciano Ort, [facility]  No chief complaint on file.     HPI: The patient is a 35 year old gentleman.  Presents today complaining of symptoms of his right lower extremity.  He is status post arthroscopy of the right knee unfortunately had continued issues after surgery did not feel he got much relief with his arthroscopy and debridement.  Today he is complaining of increased pain with weightbearing primarily over the anterior and lateral shin and lower leg.  He does have some low back pain this is acute on chronic.  He is having some shooting pains around his right hip as well  Has not had any injuries.  No falls.  This is been gradually worsening over the last few weeks.  There is some numbness associated with this out in his toes on the right lower extremity worse pain with ambulation and standing denies pain sitting.  He is uncomfortable lying in bed he cannot lie flat cannot lie on his right side  Reports he has had issues with his back for many years now he previously resided in [place] states he had an MRI of his lumbar spine about 5 years ago he did have 1 ESI he felt this worsened his symptoms is quite resistant to another ESI at this time.  States he was had mild degenerative disc disease is resistant to repeat MRI  Assessment & Plan: Visit Diagnoses:  1. Radiculopathy, lumbar region     Plan: We will trial a prednisone burst.  Discussed possibly referring him to spine or pain management.  Will attempt to obtain previous MRI scan from [facility] orthopedics in [place].  The patient has signed a medical records release form.  Follow-Up Instructions: Return if symptoms worsen or fail to improve.   Right Knee Exam   Tenderness  The patient is experiencing  tenderness in the medial joint line.  Tests  Varus: negative Valgus: negative  Other  Erythema: absent Swelling: mild   Back Exam   Tenderness  The patient is experiencing tenderness in the lumbar.  Range of Motion  The patient has normal back ROM.  Muscle Strength  The patient has normal back strength.  Tests  Straight leg raise right: positive Straight leg raise left: negative      Patient is alert, oriented, no adenopathy, well-dressed, normal affect, normal respiratory effort.   Imaging: No results found. No images are attached to the encounter.  Labs: No results found for: HGBA1C, ESRSEDRATE, CRP, LABURIC, REPTSTATUS, GRAMSTAIN, CULT, LABORGA   No results found for: ALBUMIN, PREALBUMIN, CBC  No results found for: MG No results found for: VD25OH  No results found for: PREALBUMIN No flowsheet data found.   There is no height or weight on file to calculate BMI.  Orders:  Orders Placed This Encounter  Procedures  . XR Lumbar Spine 2-3 Views   Meds ordered this encounter  Medications  . predniSONE (DELTASONE) 50 MG tablet    Sig: Take one tablet by mouth once daily for 5 days.    Dispense:  5 tablet    Refill:  0     Procedures:  No procedures performed  Clinical Data: No additional findings.  ROS:  All other systems negative, except as noted in the HPI. Review of Systems  Objective: Vital Signs: There were no vitals taken for this visit.  Specialty Comments:  No specialty comments available.  PMFS History: Patient Active Problem List   Diagnosis Date Noted  . Unilateral primary osteoarthritis, right knee    Past Medical History:  Diagnosis Date  . Diabetes mellitus without complication (HCC)   . History of COVID-19 11/21/2020  . Hypertension   . Sleep apnea     History reviewed. No pertinent family history.  Past Surgical History:  Procedure Laterality Date  . ABDOMINAL SURGERY    . HERNIA REPAIR    . KNEE ARTHROSCOPY  Right 01/22/2021   Procedure: RIGHT KNEE ARTHROSCOPY AND DEBRIDEMENT;  Surgeon: Nadara Mustard, MD;  Location: [facility];  Service: Orthopedics;  Laterality: Right;   Social History   Occupational History  . Not on file  Tobacco Use  . Smoking status: Current Every Day Smoker    Packs/day: 0.25    Types: Cigarettes  . Smokeless tobacco: Never Used  Vaping Use  . Vaping Use: Never used  Substance and Sexual Activity  . Alcohol use: Not Currently  . Drug use: Not Currently  . Sexual activity: Not on file

## 2021-03-31 ENCOUNTER — Encounter (INDEPENDENT_AMBULATORY_CARE_PROVIDER_SITE_OTHER): Payer: Self-pay

## 2021-04-03 ENCOUNTER — Telehealth: Payer: Self-pay | Admitting: Family

## 2021-04-03 NOTE — Telephone Encounter (Signed)
Pt last in office 03/26/21 please see below and advise.

## 2021-04-03 NOTE — Telephone Encounter (Signed)
Patient called requesting a call back from PA Lemay. Patient states he is still experiencing pains in back and was told to call if pains continue. Please call patient at [phone].

## 2021-04-11 ENCOUNTER — Ambulatory Visit
Admission: EM | Admit: 2021-04-11 | Discharge: 2021-04-11 | Disposition: A | Discharge status: Home / Self Care | Payer: Medicaid Other | Attending: Internal Medicine | Admitting: Internal Medicine

## 2021-04-11 ENCOUNTER — Encounter: Payer: Self-pay | Admitting: Emergency Medicine

## 2021-04-11 ENCOUNTER — Other Ambulatory Visit: Payer: Self-pay

## 2021-04-11 DIAGNOSIS — G501 Atypical facial pain: Secondary | ICD-10-CM | POA: Diagnosis not present

## 2021-04-11 MED ORDER — VALACYCLOVIR HCL 1 G PO TABS: 1000.0000 mg | ORAL_TABLET | Freq: Three times a day (TID) | ORAL | 0 refills | Status: AC

## 2021-04-11 MED ORDER — PREDNISONE 10 MG (21) PO TBPK: ORAL_TABLET | Freq: Every day | ORAL | 0 refills | Status: DC

## 2021-04-11 MED ORDER — HYDROCODONE-ACETAMINOPHEN 5-325 MG PO TABS: 2.0000 | ORAL_TABLET | Freq: Four times a day (QID) | ORAL | 0 refills | Status: DC | PRN

## 2021-04-11 NOTE — ED Provider Notes (Signed)
[facility]    CSN: [id number] Arrival date & time: 04/11/21  1706      History   Chief Complaint No chief complaint on file.   HPI Derrick Loomer is a 35 y.o. male who presents with L face and ear pain since this am. He noticed just barely combing his hear on the L side it hurt.  Is edentulous on upper gums and denies dental pain. Has not had sinus congestion or fever. Closing his L eye causes pain on his face and L temple.  The pain is described as constant and sharp at times. Took Ibuprofen 800 mg and has not helped his pain.    Past Medical History:  Diagnosis Date  . Diabetes mellitus without complication (HCC)   . History of COVID-19 11/21/2020  . Hypertension   . Sleep apnea     Patient Active Problem List   Diagnosis Date Noted  . Unilateral primary osteoarthritis, right knee     Past Surgical History:  Procedure Laterality Date  . ABDOMINAL SURGERY    . HERNIA REPAIR    . KNEE ARTHROSCOPY Right 01/22/2021   Procedure: RIGHT KNEE ARTHROSCOPY AND DEBRIDEMENT;  Surgeon: Nadara Mustard, MD;  Location: [facility];  Service: Orthopedics;  Laterality: Right;       Home Medications    Prior to Admission medications   Medication Sig Start Date End Date Taking? Authorizing Provider  HYDROcodone-acetaminophen (NORCO/VICODIN) 5-325 MG tablet Take 2 tablets by mouth every 6 (six) hours as needed. 04/11/21  Yes Rodriguez-Southworth, Nettie Elm, PA-C  predniSONE (STERAPRED UNI-PAK 21 TAB) 10 MG (21) TBPK tablet Take by mouth daily. Take 6 tabs by mouth daily  for 2 days, then 5 tabs for 2 days, then 4 tabs for 2 days, then 3 tabs for 2 days, 2 tabs for 2 days, then 1 tab by mouth daily for 2 days 04/11/21  Yes Rodriguez-Southworth, Nettie Elm, PA-C  valACYclovir (VALTREX) 1000 MG tablet Take 1 tablet (1,000 mg total) by mouth 3 (three) times daily for 10 days. 04/11/21 04/21/21 Yes Rodriguez-Southworth, Nettie Elm, PA-C  ibuprofen (ADVIL) 800 MG tablet Take 800 mg by  mouth every 8 (eight) hours as needed.    [provider]  metFORMIN (GLUCOPHAGE) 500 MG tablet Take by mouth 2 (two) times daily with a meal.  04/11/21  [provider]    Family History Family History  Problem Relation Age of Onset  . Diabetes Father   . Cancer Father   . Heart disease Father     Social History Social History   Tobacco Use  . Smoking status: Current Every Day Smoker    Packs/day: 0.25    Types: Cigarettes  . Smokeless tobacco: Never Used  Vaping Use  . Vaping Use: Never used  Substance Use Topics  . Alcohol use: Not Currently  . Drug use: Not Currently     Allergies   Alfalfa and Trulicity [dulaglutide]   Review of Systems Review of Systems  Constitutional: Negative for activity change and fever.  HENT: Positive for ear pain. Negative for congestion, dental problem, facial swelling, rhinorrhea, sinus pressure, sinus pain, sore throat and trouble swallowing.   Eyes: Negative for pain, discharge, redness and visual disturbance.  Musculoskeletal: Negative for gait problem.  Skin: Negative for rash and wound.  Neurological: Negative for numbness.  Hematological: Negative for adenopathy.     Physical Exam Triage Vital Signs ED Triage Vitals [04/11/21 1718]  Enc Vitals Group  BP (!) 159/105     Pulse Rate 99     Resp 18     Temp 97.7 F (36.5 C)     Temp Source Oral     SpO2 97 %     Weight      Height      Head Circumference      Peak Flow      Pain Score 4     Pain Loc      Pain Edu?      Excl. in GC?    No data found.  Updated Vital Signs BP (!) 159/105 (BP Location: Right Arm) Comment: states he has not been taking his BP medication  Pulse 99   Temp 97.7 F (36.5 C) (Oral)   Resp 18   SpO2 97%   Visual Acuity Right Eye Distance:   Left Eye Distance:   Bilateral Distance:    Right Eye Near:   Left Eye Near:    Bilateral Near:     Physical Exam Vitals and nursing note reviewed.  Constitutional:       General: He is not in acute distress.    Appearance: He is obese. He is not toxic-appearing.  HENT:     Head: Normocephalic.     Right Ear: Tympanic membrane, ear canal and external ear normal.     Left Ear: Tympanic membrane and ear canal normal.     Ears:     Comments: L canal is a little red, but not painful with external ear motion    Nose: Nose normal.     Mouth/Throat:     Mouth: Mucous membranes are moist.     Comments: Upper gums are edentulous, no signs of infection, but L mid upper is tender with palpation. No swelling noted.  Eyes:     General: No scleral icterus.    Conjunctiva/sclera: Conjunctivae normal.  Neck:     Comments: Has small mobile node on L upper chain which is tender and pressing on this causes face pain.  Pulmonary:     Effort: Pulmonary effort is normal.  Musculoskeletal:        General: Normal range of motion.     Cervical back: Neck supple.  Lymphadenopathy:     Cervical: Cervical adenopathy present.  Skin:    General: Skin is warm and dry.     Findings: No bruising, erythema, lesion or rash.  Neurological:     Mental Status: He is alert and oriented to person, place, and time.     Gait: Gait normal.     Comments: Has hyperalgesia with light touch of his hair and face where the pain is Does not have facial asymmetry  Psychiatric:        Mood and Affect: Mood normal.        Behavior: Behavior normal.        Thought Content: Thought content normal.        Judgment: Judgment normal.      UC Treatments / Results  Labs (all labs ordered are listed, but only abnormal results are displayed) Labs Reviewed - No data to display  EKG   Radiology No results found.  Procedures Procedures (including critical care time)  Medications Ordered in UC Medications - No data to display  Initial Impression / Assessment and Plan / UC Course  I have reviewed the triage vital signs and the nursing notes. L face and scalp pain which I suspect early  shingles or possibly  trigeminal neuralgia. I placed him on prednisone and valtrex as noted. Needs to go to ER if not improving in a few days. See instructions.  I placed him on Norco for pain.  Final Clinical Impressions(s) / UC Diagnoses   Final diagnoses:  Atypical face pain     Discharge Instructions     Go to ER if you get worse in 48-72 hours     ED Prescriptions    Medication Sig Dispense Auth. Provider   predniSONE (STERAPRED UNI-PAK 21 TAB) 10 MG (21) TBPK tablet Take by mouth daily. Take 6 tabs by mouth daily  for 2 days, then 5 tabs for 2 days, then 4 tabs for 2 days, then 3 tabs for 2 days, 2 tabs for 2 days, then 1 tab by mouth daily for 2 days 42 tablet Rodriguez-Southworth, Nettie Elm, PA-C   valACYclovir (VALTREX) 1000 MG tablet Take 1 tablet (1,000 mg total) by mouth 3 (three) times daily for 10 days. 30 tablet Rodriguez-Southworth, Nettie Elm, PA-C   HYDROcodone-acetaminophen (NORCO/VICODIN) 5-325 MG tablet Take 2 tablets by mouth every 6 (six) hours as needed. 10 tablet Rodriguez-Southworth, Nettie Elm, PA-C     I have reviewed the PDMP during this encounter.   Garey Ham, [place] 04/11/21 2048

## 2021-04-11 NOTE — ED Triage Notes (Signed)
Left side of face and ear pain since this morning.

## 2021-04-11 NOTE — Discharge Instructions (Signed)
Go to ER if you get worse in 48-72 hours

## 2021-04-16 NOTE — Telephone Encounter (Signed)
Do you mind offering an appoitnemtn with yates or nitka, or we can refer to neurosurgery,   still awaiting his mri from [place]

## 2021-05-07 ENCOUNTER — Ambulatory Visit (INDEPENDENT_AMBULATORY_CARE_PROVIDER_SITE_OTHER): Payer: Medicaid Other | Admitting: Orthopaedic Surgery

## 2021-05-07 VITALS — BP 144/96 | HR 85 | Ht 76.0 in | Wt 375.0 lb

## 2021-05-07 DIAGNOSIS — M545 Low back pain, unspecified: Secondary | ICD-10-CM

## 2021-05-07 DIAGNOSIS — G8929 Other chronic pain: Secondary | ICD-10-CM | POA: Diagnosis not present

## 2021-05-07 NOTE — Progress Notes (Signed)
Office Visit Note   Patient: Derrick Barnes           Date of Birth: 02-Feb-1986           MRN: [id number] Visit Date: 05/07/2021              Requested by: Kathalene Frames [facility] [address] [place],  [place] [address] PCP: Ponciano Ort, [facility]   Assessment & Plan: Visit Diagnoses:  1. Chronic bilateral low back pain, unspecified whether sciatica present   2. Chronic low back pain without sciatica, unspecified back pain laterality     Plan: We will set patient up for some physical therapy.  Patient relates he had an MRI scan 3 to 4 years ago in [place] and it did not show any severe compression.  Recheck 6 weeks.  Follow-Up Instructions: Return in about 6 weeks (around 06/18/2021).   Orders:  Orders Placed This Encounter  Procedures   Ambulatory referral to Physical Therapy   No orders of the defined types were placed in this encounter.     Procedures: No procedures performed   Clinical Data: No additional findings.   Subjective: Chief Complaint  Patient presents with   Lower Back - Pain    HPI 35 year old male states he has had low back pain for 5 years.  He states he had an MRI scan in [facility] which showed mild changes no further treatment was recommended.  He states he is applied for disability both in [place] also [place] has been turned down several times.  He has been on ibuprofen as well as Tylenol.  He states he has back pain pain radiates into his right leg which is the same side is he had right knee arthroscopy for partial meniscectomy by Dr. Lajoyce Corners in March 2022.  He states he has had some numbness in the back of his left heel.  He is ambulatory with a glass cane.  Patient states he has not worked since December 2021.  He states with his fiance.  PDMP review shows some postop pain medication after his knee scope otherwise unremarkable.  Patient denies associated bowel or bladder symptoms.  Patient's had lumbar spine radiographs which were  unremarkable.  All the systems noncontributory to HPI.  Negative for stroke or MI negative for seizures.  Positive for previous knee arthroscopy with degenerative meniscus right knee.  Review of Systems Negative other than as mentioned in HPI.  Objective: Vital Signs: BP (!) 144/96   Pulse 85   Ht 6\' 4"  (1.93 m)   Wt (!) 375 lb (170.1 kg)   BMI 45.65 kg/m   Physical Exam Constitutional:      Appearance: He is well-developed.  HENT:     Head: Normocephalic and atraumatic.     Right Ear: External ear normal.     Left Ear: External ear normal.  Eyes:     Pupils: Pupils are equal, round, and reactive to light.  Neck:     Thyroid: No thyromegaly.     Trachea: No tracheal deviation.  Cardiovascular:     Rate and Rhythm: Normal rate.  Pulmonary:     Effort: Pulmonary effort is normal.     Breath sounds: No wheezing.  Abdominal:     General: Bowel sounds are normal.     Palpations: Abdomen is soft.  Musculoskeletal:     Cervical back: Neck supple.  Skin:    General: Skin is warm and dry.     Capillary  Refill: Capillary refill takes less than 2 seconds.  Neurological:     Mental Status: He is alert and oriented to person, place, and time.  Psychiatric:        Behavior: Behavior normal.        Thought Content: Thought content normal.        Judgment: Judgment normal.    Ortho Exam negative straight leg raising 90 right and left negative popliteal compression test.  He is able to heel and toe walk.  Well-healed arthroscopic portals no knee effusion on the right.  Negative logroll.  EHL anterior tib is strong.  No pitting edema lower extremities.  Specialty Comments:  No specialty comments available.  Imaging: No results found.   PMFS History: Patient Active Problem List   Diagnosis Date Noted   Low back pain 05/07/2021   Unilateral primary osteoarthritis, right knee    Past Medical History:  Diagnosis Date   Diabetes mellitus without complication (HCC)    History  of COVID-19 11/21/2020   Hypertension    Sleep apnea     Family History  Problem Relation Age of Onset   Diabetes Father    Cancer Father    Heart disease Father     Past Surgical History:  Procedure Laterality Date   ABDOMINAL SURGERY     HERNIA REPAIR     KNEE ARTHROSCOPY Right 01/22/2021   Procedure: RIGHT KNEE ARTHROSCOPY AND DEBRIDEMENT;  Surgeon: Nadara Mustard, MD;  Location: [facility];  Service: Orthopedics;  Laterality: Right;   Social History   Occupational History   Not on file  Tobacco Use   Smoking status: Every Day    Packs/day: 0.25    Pack years: 0.00    Types: Cigarettes   Smokeless tobacco: Never  Vaping Use   Vaping Use: Never used  Substance and Sexual Activity   Alcohol use: Not Currently   Drug use: Not Currently   Sexual activity: Not on file

## 2021-05-20 ENCOUNTER — Ambulatory Visit (HOSPITAL_COMMUNITY): Payer: Medicaid Other | Attending: Orthopaedic Surgery | Admitting: Physical Therapy

## 2021-05-20 ENCOUNTER — Other Ambulatory Visit: Payer: Self-pay

## 2021-05-20 ENCOUNTER — Encounter (HOSPITAL_COMMUNITY): Payer: Self-pay | Admitting: Physical Therapy

## 2021-05-20 DIAGNOSIS — R29898 Other symptoms and signs involving the musculoskeletal system: Secondary | ICD-10-CM | POA: Diagnosis present

## 2021-05-20 DIAGNOSIS — M6281 Muscle weakness (generalized): Secondary | ICD-10-CM | POA: Insufficient documentation

## 2021-05-20 DIAGNOSIS — M545 Low back pain, unspecified: Secondary | ICD-10-CM | POA: Diagnosis present

## 2021-05-20 DIAGNOSIS — R2689 Other abnormalities of gait and mobility: Secondary | ICD-10-CM | POA: Diagnosis present

## 2021-05-20 NOTE — Therapy (Signed)
[facility] [address] [place], [place], [address] Phone: [phone]   Fax:  [phone]  Physical Therapy Evaluation  Patient Details  Name: Derrick Barnes MRN: [id number] Date of Birth: 1986-06-06 Referring Provider (PT): Annell Greening MD   Encounter Date: 05/20/2021   PT End of Session - 05/20/21 0817     Visit Number 1    Number of Visits 8    Date for PT Re-Evaluation 06/17/21    Authorization Type Medicaid [employer], (no auth required for first 12 visits)    Authorization - Visit Number 1    Authorization - Number of Visits 12    PT Start Time 0745    PT Stop Time 0815    PT Time Calculation (min) 30 min    Activity Tolerance Patient tolerated treatment well    Behavior During Therapy [facility] for tasks assessed/performed             Past Medical History:  Diagnosis Date   Diabetes mellitus without complication (HCC)    History of COVID-19 11/21/2020   Hypertension    Sleep apnea     Past Surgical History:  Procedure Laterality Date   ABDOMINAL SURGERY     HERNIA REPAIR     KNEE ARTHROSCOPY Right 01/22/2021   Procedure: RIGHT KNEE ARTHROSCOPY AND DEBRIDEMENT;  Surgeon: Nadara Mustard, MD;  Location:  SURGERY CENTER;  Service: Orthopedics;  Laterality: Right;    There were no vitals filed for this visit.    Subjective Assessment - 05/20/21 0745     Subjective Patient is a 35 y.o. male who presents to physical therapy with c/o chronic LBP. Patient states symptoms have been going on about 7 years with insidious onset. He tried PT about 5 years ago but it didn't help and he doesn't remember what he did. They dismissed his symptoms when he went to MD. Symptoms worse with everything. Symptoms decrease with laying still in a certain position. Better laying on stomach. Patient states his main goal is make his back feel better. He has to do therapy for them to cover MRI. Patient states RLE radicular symptoms down to  toes.    Pertinent History chronic back pain    Limitations Sitting;Lifting;Standing;Walking;House hold activities    Patient Stated Goals decrease pain    Currently in Pain? Yes    Pain Score 6     Pain Location Back    Pain Orientation Lower    Pain Descriptors / Indicators Dull;Sharp    Pain Type Chronic pain    Pain Onset More than a month ago    Pain Frequency Constant                [facility] PT Assessment - 05/20/21 0001       Assessment   Medical Diagnosis Chronic LBP    Referring Provider (PT) Annell Greening MD    Onset Date/Surgical Date 05/20/14    Next MD Visit Aug 14    Prior Therapy yes back and shoulder      Precautions   Precautions None      Restrictions   Weight Bearing Restrictions No      Balance Screen   Has the patient fallen in the past 6 months Yes    How many times? 7   because of knee mostly   Has the patient had a decrease in activity level because of a fear of falling?  No    Is the patient  reluctant to leave their home because of a fear of falling?  No      Prior Function   Level of Independence Independent    Vocation Unemployed      Cognition   Overall Cognitive Status Within Functional Limits for tasks assessed      Observation/Other Assessments   Observations Ambulates without AD    Focus on Therapeutic Outcomes (FOTO)  n/a      Sensation   Light Touch Impaired Detail    Additional Comments decreased L4-S1 on RLE      ROM / Strength   AROM / PROM / Strength AROM;Strength      AROM   Overall AROM Comments pain with flex/ext worst with extension    AROM Assessment Site Lumbar    Lumbar Flexion 50% limited    Lumbar Extension 50% limited    Lumbar - Right Side Bend 25% limited    Lumbar - Left Side Bend 25% limited    Lumbar - Right Rotation 25% limited    Lumbar - Left Rotation 25% limited      Strength   Strength Assessment Site Hip;Knee;Ankle    Right/Left Hip Right;Left    Right Hip Flexion 4/5    Left Hip Flexion 5/5     Right/Left Knee Right;Left    Right Knee Flexion 4/5    Right Knee Extension 4/5    Left Knee Flexion 5/5    Left Knee Extension 5/5    Right/Left Ankle Right;Left    Right Ankle Dorsiflexion 5/5    Left Ankle Dorsiflexion 5/5      Palpation   Spinal mobility hypomobile thoracic and lumbar, very painful throughout    Palpation comment grossly tender lumbar and thoracic paraspinals      Transfers   Comments labored      Ambulation/Gait   Ambulation/Gait Yes    Ambulation/Gait Assistance 6: Modified independent (Device/Increase time)    Ambulation Distance (Feet) 310 Feet    Assistive device None    Ambulation Surface Level;Indoor    Gait velocity decreased    Gait Comments , states gradual increase in LBP                        Objective measurements completed on examination: See above findings.       [facility] Adult PT Treatment/Exercise - 05/20/21 0001       Exercises   Exercises Lumbar      Lumbar Exercises: Supine   Bent Knee Raise 10 reps    Bridge 10 reps                    PT Education - 05/20/21 0745     Education Details Patient educated on exam findings, POC, scope of PT, HEP    Person(s) Educated Patient    Methods Explanation;Demonstration;Handout    Comprehension Verbalized understanding;Returned demonstration              PT Short Term Goals - 05/20/21 0820       PT SHORT TERM GOAL #1   Title Patient will be independent with HEP in order to improve functional outcomes.    Time 2    Period Weeks    Status New    Target Date 06/03/21      PT SHORT TERM GOAL #2   Title Patient will report at least 25% improvement in symptoms for improved quality of life.    Time 2  Period Weeks    Status New    Target Date 06/03/21               PT Long Term Goals - 05/20/21 0821       PT LONG TERM GOAL #1   Title Patient will report at least 75% improvement in symptoms for improved quality of life.    Time 4     Period Weeks    Status New    Target Date 06/17/21      PT LONG TERM GOAL #2   Title Patient will demonstrate at least 25% improvement in lumbar ROM in all planes for improved ability to move trunk while completing chores.    Time 4    Period Weeks    Status New    Target Date 06/17/21                    Plan - 05/20/21 0818     Clinical Impression Statement Patient is a 35 y.o. male who presents to physical therapy with c/o chronic LBP. He presents with pain limited deficits in lumbar strength, ROM, endurance, postural impairments, spinal mobility and functional mobility with ADL. He is having to modify and restrict ADL as indicated by subjective information and objective measures which is affecting overall participation. Patient will benefit from skilled physical therapy in order to improve function and reduce impairment.    Personal Factors and Comorbidities Fitness;Past/Current Experience;Comorbidity 2;Time since onset of injury/illness/exacerbation    Comorbidities chronic LBP, obesity    Examination-Activity Limitations Locomotion Level;Transfers;Bend;Lift;Stand;Stairs;Squat;Sit    Examination-Participation Restrictions Meal Prep;Cleaning;Occupation;Community Activity;Shop;Volunteer;Aaron Mose    Stability/Clinical Decision Making Stable/Uncomplicated    Clinical Decision Making Low    Rehab Potential Fair    PT Frequency 2x / week    PT Duration 4 weeks    PT Treatment/Interventions ADLs/Self Care Home Management;Aquatic Therapy;Cryotherapy;Electrical Stimulation;Iontophoresis 4mg /ml Dexamethasone;Moist Heat;Traction;Ultrasound;Parrafin;DME Instruction;Gait training;Stair training;Functional mobility training;Therapeutic activities;Therapeutic exercise;Balance training;Neuromuscular re-education;Patient/family education;Orthotic Fit/Training;Manual techniques;Passive range of motion;Dry needling;Energy conservation;Splinting;Taping;Spinal Manipulations;Joint  Manipulations    PT Next Visit Plan continue core and hip strengthening, improve mobility as able    PT Home Exercise Plan bridge, march    Consulted and Agree with Plan of Care Patient             Patient will benefit from skilled therapeutic intervention in order to improve the following deficits and impairments:  Abnormal gait, Difficulty walking, Decreased range of motion, Decreased endurance, Increased muscle spasms, Decreased activity tolerance, Pain, Decreased balance, Impaired flexibility, Hypomobility, Improper body mechanics, Postural dysfunction, Decreased strength, Decreased mobility  Visit Diagnosis: Low back pain, unspecified back pain laterality, unspecified chronicity, unspecified whether sciatica present  Muscle weakness (generalized)  Other abnormalities of gait and mobility  Other symptoms and signs involving the musculoskeletal system     Problem List Patient Active Problem List   Diagnosis Date Noted   Low back pain 05/07/2021   Unilateral primary osteoarthritis, right knee     8:22 AM, 05/20/21 07/21/21 PT, DPT Physical Therapist at [facility]   [facility] [address] [place], [place], [place] Phone: [phone]   Fax:  [phone]  Name: Tykel Badie MRN: [place] Date of Birth: June 14, 1986

## 2021-05-20 NOTE — Patient Instructions (Signed)
Access Code: [email or web address] URL: [email or web address] Date: 05/20/2021 Prepared by: [facility] Taleya Whitcher  Exercises Beginner Bridge - 1 x daily - 7 x weekly - 2 sets - 10 reps Supine March - 1 x daily - 7 x weekly - 2 sets - 10 reps

## 2021-05-28 ENCOUNTER — Encounter (HOSPITAL_COMMUNITY): Payer: Medicaid Other | Admitting: Physical Therapy

## 2021-05-30 ENCOUNTER — Telehealth (HOSPITAL_COMMUNITY): Payer: Self-pay | Admitting: Physical Therapy

## 2021-05-30 ENCOUNTER — Ambulatory Visit (HOSPITAL_COMMUNITY): Payer: Medicaid Other | Admitting: Physical Therapy

## 2021-05-30 NOTE — Telephone Encounter (Signed)
Pt did not show today (2nd NS) or last visit on 7/19.  Called and left VM regarding missed appt and reminder for remaining appt on 7/26 at 8:30.   Lurena Nida, PTA/CLT [phone]

## 2021-06-04 ENCOUNTER — Ambulatory Visit (HOSPITAL_COMMUNITY): Payer: Medicaid Other | Admitting: Physical Therapy

## 2021-06-04 ENCOUNTER — Other Ambulatory Visit: Payer: Self-pay

## 2021-06-04 DIAGNOSIS — R2689 Other abnormalities of gait and mobility: Secondary | ICD-10-CM

## 2021-06-04 DIAGNOSIS — M6281 Muscle weakness (generalized): Secondary | ICD-10-CM

## 2021-06-04 DIAGNOSIS — M545 Low back pain, unspecified: Secondary | ICD-10-CM

## 2021-06-04 DIAGNOSIS — R29898 Other symptoms and signs involving the musculoskeletal system: Secondary | ICD-10-CM

## 2021-06-04 NOTE — Therapy (Signed)
[facility] [address] [place], [place], [address] Phone: [phone]   Fax:  [phone]  Physical Therapy Treatment  Patient Details  Name: Derrick Barnes MRN: [id number] Date of Birth: [birth year] Referring Provider (PT): Annell Greening MD   Encounter Date: 06/04/2021   PT End of Session - 06/04/21 0931     Visit Number 2    Number of Visits 8    Date for PT Re-Evaluation 06/17/21    Authorization Type Medicaid [employer], (no auth required for first 12 visits)    Authorization - Visit Number 2    Authorization - Number of Visits 12    PT Start Time [phone]    PT Stop Time 0910    PT Time Calculation (min) 34 min    Activity Tolerance Patient tolerated treatment well    Behavior During Therapy [facility] for tasks assessed/performed             Past Medical History:  Diagnosis Date   Diabetes mellitus without complication (HCC)    History of COVID-19 11/21/2020   Hypertension    Sleep apnea     Past Surgical History:  Procedure Laterality Date   ABDOMINAL SURGERY     HERNIA REPAIR     KNEE ARTHROSCOPY Right 01/22/2021   Procedure: RIGHT KNEE ARTHROSCOPY AND DEBRIDEMENT;  Surgeon: Nadara Mustard, MD;  Location: [facility];  Service: Orthopedics;  Laterality: Right;    There were no vitals filed for this visit.   Subjective Assessment - 06/04/21 0841     Subjective Pt has no reason for 2 missed appointments . STates he is no better and has not done the exercises because they hurt his back.  Current pain is 7/10 from back of Rt knee up to shoulder blades.    Currently in Pain? Yes    Pain Score 7     Pain Location Back                               [facility] Adult PT Treatment/Exercise - 06/04/21 0001       Lumbar Exercises: Stretches   Active Hamstring Stretch Right;Left;3 reps;30 seconds      Lumbar Exercises: Standing   Other Standing Lumbar Exercises 10X standing extensions    Other  Standing Lumbar Exercises hip excursions 10X each      Lumbar Exercises: Supine   Ab Set 10 reps    Bent Knee Raise 10 reps    Bridge 10 reps    Straight Leg Raise 10 reps                    PT Education - 06/04/21 0918     Education Details reviewed goals, HEP and POC moving forward.  Educated on compliance with exercises and attendance to therapy.    Person(s) Educated Patient    Methods Explanation    Comprehension Verbalized understanding              PT Short Term Goals - 06/04/21 0859       PT SHORT TERM GOAL #1   Title Patient will be independent with HEP in order to improve functional outcomes.    Time 2    Period Weeks    Status On-going    Target Date 06/03/21      PT SHORT TERM GOAL #2   Title Patient will report at least 25% improvement  in symptoms for improved quality of life.    Time 2    Period Weeks    Status On-going    Target Date 06/03/21               PT Long Term Goals - 06/04/21 0900       PT LONG TERM GOAL #1   Title Patient will report at least 75% improvement in symptoms for improved quality of life.    Time 4    Period Weeks    Status On-going      PT LONG TERM GOAL #2   Title Patient will demonstrate at least 25% improvement in lumbar ROM in all planes for improved ability to move trunk while completing chores.    Time 4    Period Weeks    Status On-going                   Plan - 06/04/21 3007     Clinical Impression Statement Explained NS policy to patient and explained that soreness/irritation is to be expected initially as he is working muscles that have not been used and it takes 4-6 weeks for mm to get stronger.  Encouraged to complete HEP regularly for best results.  Pt admits to not doing HEP and expresses annoyance in having to complete PT in order to get an MRI.  Educated with logroll technique as patient laid straight down and up on mat table.  Began with standing extensions which reports  increased pain.  Flexion also causes increased pain.   Pt reported rotations felt best of all lumbar motions.  Pt able to complete all exercises without compliant of pain however reports increased pain after completing.  Pt did express fatigue with activity.  Encouraged to begin a walking program and become more compliant with HEP.    Personal Factors and Comorbidities Fitness;Past/Current Experience;Comorbidity 2;Time since onset of injury/illness/exacerbation    Comorbidities chronic LBP, obesity    Examination-Activity Limitations Locomotion Level;Transfers;Bend;Lift;Stand;Stairs;Squat;Sit    Examination-Participation Restrictions Meal Prep;Cleaning;Occupation;Community Activity;Shop;Volunteer;Pincus Badder [facility]    Stability/Clinical Decision Making Stable/Uncomplicated    Rehab Potential Fair    PT Frequency 2x / week    PT Duration 4 weeks    PT Treatment/Interventions ADLs/Self Care Home Management;Aquatic Therapy;Cryotherapy;Electrical Stimulation;Iontophoresis 4mg /ml Dexamethasone;Moist Heat;Traction;Ultrasound;Parrafin;DME Instruction;Gait training;Stair training;Functional mobility training;Therapeutic activities;Therapeutic exercise;Balance training;Neuromuscular re-education;Patient/family education;Orthotic Fit/Training;Manual techniques;Passive range of motion;Dry needling;Energy conservation;Splinting;Taping;Spinal Manipulations;Joint Manipulations    PT Next Visit Plan continue core and hip strengthening, improve mobility as able.  Progress to standing exericses and body mechanics instruction next session.    PT Home Exercise Plan bridge, march    Consulted and Agree with Plan of Care Patient             Patient will benefit from skilled therapeutic intervention in order to improve the following deficits and impairments:  Abnormal gait, Difficulty walking, Decreased range of motion, Decreased endurance, Increased muscle spasms, Decreased activity tolerance, Pain, Decreased balance,  Impaired flexibility, Hypomobility, Improper body mechanics, Postural dysfunction, Decreased strength, Decreased mobility  Visit Diagnosis: Low back pain, unspecified back pain laterality, unspecified chronicity, unspecified whether sciatica present  Other abnormalities of gait and mobility  Other symptoms and signs involving the musculoskeletal system  Muscle weakness (generalized)     Problem List Patient Active Problem List   Diagnosis Date Noted   Low back pain 05/07/2021   Unilateral primary osteoarthritis, right knee    05/09/2021, PTA/CLT [phone]  [phone] B 06/04/2021, 9:39 AM  [facility] [address] [place], [place], [address] Phone: [phone]   Fax:  [phone]  Name: Derrick Barnes MRN: [id number] Date of Birth: 07/13/1986

## 2021-06-06 ENCOUNTER — Encounter (HOSPITAL_COMMUNITY): Payer: Medicaid Other | Admitting: Physical Therapy

## 2021-06-06 ENCOUNTER — Telehealth (HOSPITAL_COMMUNITY): Payer: Self-pay | Admitting: Physical Therapy

## 2021-06-06 NOTE — Telephone Encounter (Signed)
Pt did not show for appt (previous 2 consecutive NS and today is 3rd).  Had discussion with patient regarding NS policy last visit so he is aware that he will need to schedule 1 appointment at a time moving forward.  Pt also given a reminder today on his voicemail of next appt and need to schedule one at a time after.  Lurena Nida, PTA/CLT [phone]

## 2021-06-11 ENCOUNTER — Encounter (HOSPITAL_COMMUNITY): Payer: Medicaid Other | Admitting: Physical Therapy

## 2021-06-13 ENCOUNTER — Encounter (HOSPITAL_COMMUNITY): Payer: Medicaid Other | Admitting: Physical Therapy

## 2021-06-17 ENCOUNTER — Encounter (HOSPITAL_COMMUNITY): Payer: Medicaid Other | Admitting: Physical Therapy

## 2021-06-17 ENCOUNTER — Telehealth (HOSPITAL_COMMUNITY): Payer: Self-pay | Admitting: Physical Therapy

## 2021-06-17 NOTE — Telephone Encounter (Signed)
Pt with 2 consecutive NS, 4 NS combined.  (had 2 previously, came for 1 appt then NS last 2 appts).  Left message regarding discharge from PT due to noncompliance.  Lurena Nida, PTA/CLT [phone]

## 2021-06-18 ENCOUNTER — Encounter: Payer: Self-pay | Admitting: Orthopaedic Surgery

## 2021-06-18 ENCOUNTER — Ambulatory Visit (INDEPENDENT_AMBULATORY_CARE_PROVIDER_SITE_OTHER): Payer: Medicaid Other | Admitting: Orthopaedic Surgery

## 2021-06-18 ENCOUNTER — Other Ambulatory Visit: Payer: Self-pay

## 2021-06-18 VITALS — BP 162/108 | HR 103 | Ht 76.0 in | Wt 375.0 lb

## 2021-06-18 DIAGNOSIS — M545 Low back pain, unspecified: Secondary | ICD-10-CM

## 2021-06-18 DIAGNOSIS — G8929 Other chronic pain: Secondary | ICD-10-CM

## 2021-06-18 NOTE — Progress Notes (Signed)
Office Visit Note   Patient: Derrick Barnes           Date of Birth: 1986-04-19           MRN: [id number] Visit Date: 06/18/2021              Requested by: Ponciano Ort [facility] [address] [place],  [place] [address] PCP: Ponciano Ort, [facility]   Assessment & Plan: Visit Diagnoses:  1. Chronic low back pain without sciatica, unspecified back pain laterality     Plan: Patient with complaints of chronic back pain.  We will seek approval for a lumbar MRI scan rule out compression with right greater than left leg pain and chronic back and leg pain not responsive to conservative treatment including therapy.  Office follow-up after lumbar MRI scan.  Follow-Up Instructions: No follow-ups on file.   Orders:  No orders of the defined types were placed in this encounter.  No orders of the defined types were placed in this encounter.     Procedures: No procedures performed   Clinical Data: No additional findings.   Subjective: Chief Complaint  Patient presents with   Lower Back - Follow-up    HPI 35 year old male returns he continues to have back pain symptoms seems to be worse on the right than the left.  He started therapy a month ago and has had persistent symptoms.  Previous MRI several years ago had mild changes this is been about 3 or 4 years ago.  He has had persistent symptoms difficulty with back pain that prevents him from working.  Review of Systems All the systems update unchanged from previous 05/07/2021 office visit.  Objective: Vital Signs: BP (!) 162/108   Pulse (!) 103   Ht 6\' 4"  (1.93 m)   Wt (!) 375 lb (170.1 kg)   BMI 45.65 kg/m   Physical Exam Constitutional:      Appearance: He is well-developed.  HENT:     Head: Normocephalic and atraumatic.     Right Ear: External ear normal.     Left Ear: External ear normal.  Eyes:     Pupils: Pupils are equal, round, and reactive to light.  Neck:     Thyroid: No thyromegaly.     Trachea: No  tracheal deviation.  Cardiovascular:     Rate and Rhythm: Normal rate.  Pulmonary:     Effort: Pulmonary effort is normal.     Breath sounds: No wheezing.  Abdominal:     General: Bowel sounds are normal.     Palpations: Abdomen is soft.  Musculoskeletal:     Cervical back: Neck supple.  Skin:    General: Skin is warm and dry.     Capillary Refill: Capillary refill takes less than 2 seconds.  Neurological:     Mental Status: He is alert and oriented to person, place, and time.  Psychiatric:        Behavior: Behavior normal.        Thought Content: Thought content normal.        Judgment: Judgment normal.    Ortho Exam patient has negative straight leg raising right left.  He has slow deliberate gait he can ambulate with and without his cane.  Anterior tib EHL is strong no pitting edema lower extremities.  Specialty Comments:  No specialty comments available.  Imaging: No results found.   PMFS History: Patient Active Problem List   Diagnosis Date Noted   Low back pain 05/07/2021  Unilateral primary osteoarthritis, right knee    Past Medical History:  Diagnosis Date   Diabetes mellitus without complication (HCC)    History of COVID-19 11/21/2020   Hypertension    Sleep apnea     Family History  Problem Relation Age of Onset   Diabetes Father    Cancer Father    Heart disease Father     Past Surgical History:  Procedure Laterality Date   ABDOMINAL SURGERY     HERNIA REPAIR     KNEE ARTHROSCOPY Right 01/22/2021   Procedure: RIGHT KNEE ARTHROSCOPY AND DEBRIDEMENT;  Surgeon: Nadara Mustard, MD;  Location: [facility];  Service: Orthopedics;  Laterality: Right;   Social History   Occupational History   Not on file  Tobacco Use   Smoking status: Every Day    Packs/day: 0.25    Types: Cigarettes   Smokeless tobacco: Never  Vaping Use   Vaping Use: Never used  Substance and Sexual Activity   Alcohol use: Not Currently   Drug use: Not Currently    Sexual activity: Not on file

## 2021-06-18 NOTE — Addendum Note (Signed)
Addended by: Barbette Or on: 06/18/2021 04:22 PM   Modules accepted: Orders

## 2021-06-20 ENCOUNTER — Encounter (HOSPITAL_COMMUNITY): Payer: Medicaid Other | Admitting: Physical Therapy

## 2021-06-30 ENCOUNTER — Other Ambulatory Visit: Payer: Medicaid Other

## 2021-07-29 ENCOUNTER — Other Ambulatory Visit: Payer: Self-pay

## 2021-07-29 ENCOUNTER — Encounter: Payer: Self-pay | Admitting: Emergency Medicine

## 2021-07-29 ENCOUNTER — Ambulatory Visit
Admission: EM | Admit: 2021-07-29 | Discharge: 2021-07-29 | Disposition: A | Discharge status: Home / Self Care | Payer: PRIVATE HEALTH INSURANCE

## 2021-07-29 DIAGNOSIS — R062 Wheezing: Secondary | ICD-10-CM | POA: Diagnosis not present

## 2021-07-29 DIAGNOSIS — R059 Cough, unspecified: Secondary | ICD-10-CM

## 2021-07-29 DIAGNOSIS — Z20822 Contact with and (suspected) exposure to covid-19: Secondary | ICD-10-CM | POA: Diagnosis not present

## 2021-07-29 DIAGNOSIS — R0602 Shortness of breath: Secondary | ICD-10-CM | POA: Diagnosis not present

## 2021-07-29 MED ORDER — PROMETHAZINE-DM 6.25-15 MG/5ML PO SYRP: 5.0000 mL | ORAL_SOLUTION | Freq: Four times a day (QID) | ORAL | 0 refills | Status: DC | PRN

## 2021-07-29 MED ORDER — ALBUTEROL SULFATE HFA 108 (90 BASE) MCG/ACT IN AERS: 1.0000 | INHALATION_SPRAY | Freq: Four times a day (QID) | RESPIRATORY_TRACT | 0 refills | Status: DC | PRN

## 2021-07-29 MED ORDER — PREDNISONE 20 MG PO TABS: 40.0000 mg | ORAL_TABLET | Freq: Every day | ORAL | 0 refills | Status: DC

## 2021-07-29 NOTE — ED Provider Notes (Signed)
[facility]    CSN: [id number] Arrival date & time: 07/29/21  1605      History   Chief Complaint No chief complaint on file.   HPI Derrick Barnes is a 35 y.o. male.   Patient presenting today with 4-day history of progressively worsening cough, shortness of breath, wheezing, chest tightness, fatigue, nasal congestion, sore throat.  Denies known fever, chills, body aches, abdominal pain, nausea vomiting or diarrhea.  Taking over-the-counter cold and congestion medications with minimal relief.  Daughter sick but with different symptoms.  No known history of pulmonary disease.    Past Medical History:  Diagnosis Date   Diabetes mellitus without complication (HCC)    History of COVID-19 11/21/2020   Hypertension    Sleep apnea     Patient Active Problem List   Diagnosis Date Noted   Low back pain 05/07/2021   Unilateral primary osteoarthritis, right knee     Past Surgical History:  Procedure Laterality Date   ABDOMINAL SURGERY     HERNIA REPAIR     KNEE ARTHROSCOPY Right 01/22/2021   Procedure: RIGHT KNEE ARTHROSCOPY AND DEBRIDEMENT;  Surgeon: Nadara Mustard, MD;  Location: [facility];  Service: Orthopedics;  Laterality: Right;       Home Medications    Prior to Admission medications   Medication Sig Start Date End Date Taking? Authorizing Provider  albuterol (VENTOLIN HFA) 108 (90 Base) MCG/ACT inhaler Inhale 1-2 puffs into the lungs every 6 (six) hours as needed for wheezing or shortness of breath. 07/29/21  Yes Particia Nearing, PA-C  metFORMIN (GLUCOPHAGE) 500 MG tablet Take by mouth 2 (two) times daily with a meal.   Yes [provider]  predniSONE (DELTASONE) 20 MG tablet Take 2 tablets (40 mg total) by mouth daily with breakfast. 07/29/21  Yes Particia Nearing, PA-C  promethazine-dextromethorphan (PROMETHAZINE-DM) 6.25-15 MG/5ML syrup Take 5 mLs by mouth 4 (four) times daily as needed for cough. 07/29/21  Yes Particia Nearing, PA-C  HYDROcodone-acetaminophen (NORCO/VICODIN) 5-325 MG tablet Take 2 tablets by mouth every 6 (six) hours as needed. Patient not taking: Reported on 05/07/2021 04/11/21   Rodriguez-Southworth, Nettie Elm, PA-C  ibuprofen (ADVIL) 800 MG tablet Take 800 mg by mouth every 8 (eight) hours as needed.    [provider]  predniSONE (STERAPRED UNI-PAK 21 TAB) 10 MG (21) TBPK tablet Take by mouth daily. Take 6 tabs by mouth daily  for 2 days, then 5 tabs for 2 days, then 4 tabs for 2 days, then 3 tabs for 2 days, 2 tabs for 2 days, then 1 tab by mouth daily for 2 days Patient not taking: Reported on 05/07/2021 04/11/21   Rodriguez-Southworth, Nettie Elm, PA-C    Family History Family History  Problem Relation Age of Onset   Diabetes Father    Cancer Father    Heart disease Father     Social History Social History   Tobacco Use   Smoking status: Every Day    Packs/day: 0.25    Types: Cigarettes   Smokeless tobacco: Never  Vaping Use   Vaping Use: Never used  Substance Use Topics   Alcohol use: Not Currently   Drug use: Not Currently     Allergies   Alfalfa and Trulicity [dulaglutide]   Review of Systems Review of Systems Per HPI  Physical Exam Triage Vital Signs ED Triage Vitals  Enc Vitals Group     BP 07/29/21 1857 (!) 171/125     Pulse  Rate 07/29/21 1857 (!) 122     Resp 07/29/21 1857 20     Temp 07/29/21 1857 98.6 F (37 C)     Temp Source 07/29/21 1857 Temporal     SpO2 07/29/21 1857 95 %     Weight --      Height --      Head Circumference --      Peak Flow --      Pain Score 07/29/21 1859 3     Pain Loc --      Pain Edu? --      Excl. in GC? --    No data found.  Updated Vital Signs BP (!) 171/125 (BP Location: Right Arm)   Pulse (!) 122   Temp 98.6 F (37 C) (Temporal)   Resp 20   SpO2 95%   Visual Acuity Right Eye Distance:   Left Eye Distance:   Bilateral Distance:    Right Eye Near:   Left Eye Near:    Bilateral Near:      Physical Exam Vitals and nursing note reviewed.  Constitutional:      Appearance: Normal appearance.  HENT:     Head: Atraumatic.     Right Ear: Tympanic membrane normal.     Left Ear: Tympanic membrane normal.     Nose: Rhinorrhea present.     Mouth/Throat:     Mouth: Mucous membranes are moist.     Pharynx: Posterior oropharyngeal erythema present.  Eyes:     Extraocular Movements: Extraocular movements intact.     Conjunctiva/sclera: Conjunctivae normal.  Cardiovascular:     Rate and Rhythm: Normal rate and regular rhythm.     Heart sounds: Normal heart sounds.  Pulmonary:     Effort: Pulmonary effort is normal.     Breath sounds: Wheezing present. No rales.     Comments: Mildly tachypneic at rest.  Moderate wheezes bilaterally Musculoskeletal:        General: Normal range of motion.     Cervical back: Normal range of motion and neck supple.  Skin:    General: Skin is warm and dry.  Neurological:     General: No focal deficit present.     Mental Status: He is oriented to person, place, and time.  Psychiatric:        Mood and Affect: Mood normal.        Thought Content: Thought content normal.        Judgment: Judgment normal.     UC Treatments / Results  Labs (all labs ordered are listed, but only abnormal results are displayed) Labs Reviewed  COVID-19, FLU A+B NAA    EKG   Radiology No results found.  Procedures Procedures (including critical care time)  Medications Ordered in UC Medications - No data to display  Initial Impression / Assessment and Plan / UC Course  I have reviewed the triage vital signs and the nursing notes.  Pertinent labs & imaging results that were available during my care of the patient were reviewed by me and considered in my medical decision making (see chart for details).     Suspect viral illness causing significant pulmonary inflammation-we will treat with prednisone burst, albuterol inhaler, Phenergan DM, Mucinex and  other over-the-counter medications.  COVID PCR pending, quarantine until results return and symptoms improved.  Strict return precautions given for worsening symptoms.  Final Clinical Impressions(s) / UC Diagnoses   Final diagnoses:  Exposure to COVID-19 virus  SOB (shortness of breath)  Wheezing  Cough   Discharge Instructions   None    ED Prescriptions     Medication Sig Dispense Auth. Provider   predniSONE (DELTASONE) 20 MG tablet Take 2 tablets (40 mg total) by mouth daily with breakfast. 10 tablet Particia Nearing, PA-C   albuterol (VENTOLIN HFA) 108 (90 Base) MCG/ACT inhaler Inhale 1-2 puffs into the lungs every 6 (six) hours as needed for wheezing or shortness of breath. 18 g Roosvelt Maser [place], [place]   promethazine-dextromethorphan (PROMETHAZINE-DM) 6.25-15 MG/5ML syrup Take 5 mLs by mouth 4 (four) times daily as needed for cough. 100 mL Particia Nearing, [place]      PDMP not reviewed this encounter.   Particia Nearing, [place] 07/29/21 1954

## 2021-07-29 NOTE — ED Triage Notes (Signed)
Cough, SOB and sore throat since Thursday.

## 2021-07-30 LAB — COVID-19, FLU A+B NAA
Influenza A, NAA: NOT DETECTED
Influenza B, NAA: NOT DETECTED
SARS-CoV-2, NAA: NOT DETECTED

## 2021-08-01 ENCOUNTER — Emergency Department (HOSPITAL_COMMUNITY)
Admission: EM | Admit: 2021-08-01 | Discharge: 2021-08-01 | Disposition: A | Discharge status: Home / Self Care | Payer: Medicaid Other | Attending: Emergency Medicine | Admitting: Emergency Medicine

## 2021-08-01 ENCOUNTER — Other Ambulatory Visit: Payer: Self-pay

## 2021-08-01 ENCOUNTER — Encounter (HOSPITAL_COMMUNITY): Payer: Self-pay

## 2021-08-01 DIAGNOSIS — R059 Cough, unspecified: Secondary | ICD-10-CM | POA: Insufficient documentation

## 2021-08-01 DIAGNOSIS — Z5321 Procedure and treatment not carried out due to patient leaving prior to being seen by health care provider: Secondary | ICD-10-CM | POA: Diagnosis not present

## 2021-08-01 NOTE — ED Triage Notes (Signed)
Pt has had cough x 5 days, seen at urgent care and prescribed steroid, inhaler, and still has cough

## 2021-08-05 ENCOUNTER — Encounter (HOSPITAL_COMMUNITY): Payer: Self-pay | Admitting: Physical Therapy

## 2021-08-05 NOTE — Therapy (Signed)
[facility] [place], [place], [address] Phone: [phone]   Fax:  [phone]  Patient Details  Name: Derrick Barnes MRN: [id number] Date of Birth: 01/07/1986 Referring Provider:  No ref. provider found  Encounter Date: 08/05/2021  PHYSICAL THERAPY DISCHARGE SUMMARY  Visits from Start of Care: 2  Current functional level related to goals / functional outcomes: Unknown as patient has not returned.   Remaining deficits: Unknown as patient has not returned.   Education / Equipment: HEP and therapy compliance   Patient agrees to discharge. Patient goals were not met. Patient is being discharged due to not returning since the last visit.   9:47 AM, 08/05/21 Mearl Latin PT, DPT Physical Therapist at [facility] [place], [place], [address] Phone: [phone]   Fax:  [phone]

## 2021-08-20 ENCOUNTER — Other Ambulatory Visit: Payer: Self-pay | Admitting: Family Medicine

## 2021-08-20 NOTE — Telephone Encounter (Signed)
Requested Prescriptions  Refused Prescriptions Disp Refills  . PROAIR HFA 108 (90 Base) MCG/ACT inhaler [Pharmacy Med Name: PROAIR HFA ORAL INH (200  PFS) 8.5G] 8.5 g     Sig: INHALE 1 TO 2 PUFFS INTO THE LUNGS EVERY 6 HOURS AS NEEDED FOR WHEEZING OR SHORTNESS OF BREATH     Pulmonology:  Beta Agonists Failed - 08/20/2021  3:36 AM      Failed - One inhaler should last at least one month. If the patient is requesting refills earlier, contact the patient to check for uncontrolled symptoms.      Failed - Valid encounter within last 12 months    Recent Outpatient Visits   None

## 2021-12-03 ENCOUNTER — Emergency Department (HOSPITAL_COMMUNITY): Payer: Medicaid Other

## 2021-12-03 ENCOUNTER — Emergency Department (HOSPITAL_COMMUNITY)
Admission: EM | Admit: 2021-12-03 | Discharge: 2021-12-03 | Disposition: A | Discharge status: Home / Self Care | Payer: Medicaid Other | Attending: Emergency Medicine | Admitting: Emergency Medicine

## 2021-12-03 ENCOUNTER — Encounter (HOSPITAL_COMMUNITY): Payer: Self-pay | Admitting: Emergency Medicine

## 2021-12-03 ENCOUNTER — Other Ambulatory Visit: Payer: Self-pay

## 2021-12-03 DIAGNOSIS — Z7984 Long term (current) use of oral hypoglycemic drugs: Secondary | ICD-10-CM | POA: Diagnosis not present

## 2021-12-03 DIAGNOSIS — I10 Essential (primary) hypertension: Secondary | ICD-10-CM | POA: Insufficient documentation

## 2021-12-03 DIAGNOSIS — E119 Type 2 diabetes mellitus without complications: Secondary | ICD-10-CM | POA: Diagnosis not present

## 2021-12-03 DIAGNOSIS — R39198 Other difficulties with micturition: Secondary | ICD-10-CM | POA: Insufficient documentation

## 2021-12-03 DIAGNOSIS — R109 Unspecified abdominal pain: Secondary | ICD-10-CM | POA: Insufficient documentation

## 2021-12-03 LAB — URINALYSIS, ROUTINE W REFLEX MICROSCOPIC
Bilirubin Urine: NEGATIVE
Glucose, UA: NEGATIVE mg/dL
Hgb urine dipstick: NEGATIVE
Ketones, ur: 5 mg/dL — AB
Leukocytes,Ua: NEGATIVE
Nitrite: NEGATIVE
Protein, ur: 30 mg/dL — AB
Specific Gravity, Urine: 1.029 (ref 1.005–1.030)
pH: 5 (ref 5.0–8.0)

## 2021-12-03 LAB — CBC WITH DIFFERENTIAL/PLATELET
Abs Immature Granulocytes: 0.06 10*3/uL (ref 0.00–0.07)
Basophils Absolute: 0.1 10*3/uL (ref 0.0–0.1)
Basophils Relative: 2 %
Eosinophils Absolute: 0.2 10*3/uL (ref 0.0–0.5)
Eosinophils Relative: 3 %
HCT: 41.9 % (ref 39.0–52.0)
Hemoglobin: 15.1 g/dL (ref 13.0–17.0)
Immature Granulocytes: 1 %
Lymphocytes Relative: 35 %
Lymphs Abs: 2 10*3/uL (ref 0.7–4.0)
MCH: 31.1 pg (ref 26.0–34.0)
MCHC: 36 g/dL (ref 30.0–36.0)
MCV: 86.2 fL (ref 80.0–100.0)
Monocytes Absolute: 0.7 10*3/uL (ref 0.1–1.0)
Monocytes Relative: 13 %
Neutro Abs: 2.7 10*3/uL (ref 1.7–7.7)
Neutrophils Relative %: 46 %
Platelets: 193 10*3/uL (ref 150–400)
RBC: 4.86 MIL/uL (ref 4.22–5.81)
RDW: 12.9 % (ref 11.5–15.5)
WBC: 5.7 10*3/uL (ref 4.0–10.5)
nRBC: 0 % (ref 0.0–0.2)

## 2021-12-03 LAB — COMPREHENSIVE METABOLIC PANEL
ALT: 30 U/L (ref 0–44)
AST: 22 U/L (ref 15–41)
Albumin: 4.2 g/dL (ref 3.5–5.0)
Alkaline Phosphatase: 80 U/L (ref 38–126)
Anion gap: 5 (ref 5–15)
BUN: 15 mg/dL (ref 6–20)
CO2: 24 mmol/L (ref 22–32)
Calcium: 8.9 mg/dL (ref 8.9–10.3)
Chloride: 106 mmol/L (ref 98–111)
Creatinine, Ser: 1.04 mg/dL (ref 0.61–1.24)
GFR, Estimated: >60 mL/min (ref >60)
Glucose, Bld: 133 mg/dL — ABNORMAL HIGH (ref 70–99)
Potassium: 3.7 mmol/L (ref 3.5–5.1)
Sodium: 135 mmol/L (ref 135–145)
Total Bilirubin: 0.8 mg/dL (ref 0.3–1.2)
Total Protein: 7.1 g/dL (ref 6.5–8.1)

## 2021-12-03 LAB — LACTIC ACID, PLASMA: Lactic Acid, Venous: 1.1 mmol/L (ref 0.5–1.9)

## 2021-12-03 LAB — LIPASE, BLOOD: Lipase: 69 U/L — ABNORMAL HIGH (ref 11–51)

## 2021-12-03 MED ORDER — SUCRALFATE 1 GM/10ML PO SUSP: 1.0000 g | Freq: Three times a day (TID) | ORAL | 0 refills | Status: DC

## 2021-12-03 MED ORDER — PANTOPRAZOLE SODIUM 40 MG PO TBEC: 40.0000 mg | DELAYED_RELEASE_TABLET | Freq: Every day | ORAL | 0 refills | Status: DC

## 2021-12-03 MED ORDER — KETOROLAC TROMETHAMINE 30 MG/ML IJ SOLN
30.0000 mg | Freq: Once | INTRAMUSCULAR | Status: AC
  Administered 2021-12-03: 06:00:00 30 mg via INTRAVENOUS
  Filled 2021-12-03: qty 1

## 2021-12-03 MED ORDER — ALUM & MAG HYDROXIDE-SIMETH 200-200-20 MG/5ML PO SUSP
30.0000 mL | Freq: Once | ORAL | Status: AC
  Administered 2021-12-03: 09:00:00 30 mL via ORAL
  Filled 2021-12-03: qty 30

## 2021-12-03 NOTE — ED Triage Notes (Signed)
Pt c/o left flank pain that started this am and states it is hard to urinate.

## 2021-12-03 NOTE — Discharge Instructions (Signed)
If you develop worsening, continued, or recurrent abdominal pain, uncontrolled vomiting, fever, chest or back pain, or any other new/concerning symptoms then return to the ER for evaluation.  

## 2021-12-03 NOTE — ED Provider Notes (Signed)
[facility] EMERGENCY DEPARTMENT Provider Note   CSN: [id number] Arrival date & time: 12/03/21  0533     History  Chief Complaint  Patient presents with   Flank Pain    Derrick Barnes is a 36 y.o. male.  The history is provided by the patient.  Flank Pain He has history of hypertension, diabetes, sleep apnea and comes in because of pain in the left mid abdomen which started about 1 AM.  He describes a dull pain which he rates at 8/10.  He denies any nausea or vomiting.  He has noted some difficulty urinating.  He denies any dysuria.  He denies fever, chills, sweats.  He has not taken anything for pain.  He has never had anything like this before.   Home Medications Prior to Admission medications   Medication Sig Start Date End Date Taking? Authorizing Provider  albuterol (VENTOLIN HFA) 108 (90 Base) MCG/ACT inhaler Inhale 1-2 puffs into the lungs every 6 (six) hours as needed for wheezing or shortness of breath. 07/29/21   Volney American, PA-C  HYDROcodone-acetaminophen (NORCO/VICODIN) 5-325 MG tablet Take 2 tablets by mouth every 6 (six) hours as needed. Patient not taking: Reported on 05/07/2021 04/11/21   Rodriguez-Southworth, Sunday Spillers, PA-C  ibuprofen (ADVIL) 800 MG tablet Take 800 mg by mouth every 8 (eight) hours as needed.    [provider]  metFORMIN (GLUCOPHAGE) 500 MG tablet Take by mouth 2 (two) times daily with a meal.    [provider]  predniSONE (DELTASONE) 20 MG tablet Take 2 tablets (40 mg total) by mouth daily with breakfast. 07/29/21   Volney American, PA-C  predniSONE (STERAPRED UNI-PAK 21 TAB) 10 MG (21) TBPK tablet Take by mouth daily. Take 6 tabs by mouth daily  for 2 days, then 5 tabs for 2 days, then 4 tabs for 2 days, then 3 tabs for 2 days, 2 tabs for 2 days, then 1 tab by mouth daily for 2 days Patient not taking: Reported on 05/07/2021 04/11/21   Rodriguez-Southworth, Sunday Spillers, PA-C  promethazine-dextromethorphan (PROMETHAZINE-DM)  6.25-15 MG/5ML syrup Take 5 mLs by mouth 4 (four) times daily as needed for cough. 07/29/21   Volney American, PA-C      Allergies    Alfalfa and Trulicity [dulaglutide]    Review of Systems   Review of Systems  Genitourinary:  Positive for flank pain.  All other systems reviewed and are negative.  Physical Exam Updated Vital Signs BP (!) 160/110    Pulse 87    Temp 98.1 F (36.7 C) (Oral)    Resp 18    Ht 6\' 3"  (1.905 m)    Wt (!) 168.7 kg    SpO2 99%    BMI 46.50 kg/m  Physical Exam Vitals and nursing note reviewed.  36 year old male, resting comfortably and in no acute distress. Vital signs are significant for elevated blood pressure. Oxygen saturation is 99%, which is normal. Head is normocephalic and atraumatic. PERRLA, EOMI. Oropharynx is clear. Neck is nontender and supple without adenopathy or JVD. Back is nontender and there is no CVA tenderness. Lungs are clear without rales, wheezes, or rhonchi. Chest is nontender. Heart has regular rate and rhythm without murmur. Abdomen is soft, flat, with mild tenderness in the left mid abdomen laterally.  There is no rebound or guarding.  Peristalsis is hypoactive. Extremities have no cyanosis or edema, full range of motion is present. Skin is warm and dry without rash. Neurologic: Mental status is  normal, cranial nerves are intact, moves all extremities equally  ED Results / Procedures / Treatments   Labs (all labs ordered are listed, but only abnormal results are displayed) Labs Reviewed  COMPREHENSIVE METABOLIC PANEL  LACTIC ACID, PLASMA  LACTIC ACID, PLASMA  CBC WITH DIFFERENTIAL/PLATELET  URINALYSIS, ROUTINE W REFLEX MICROSCOPIC    EKG None  Radiology No results found.  Procedures Procedures    Medications Ordered in ED Medications  ketorolac (TORADOL) 30 MG/ML injection 30 mg (30 mg Intravenous Given 12/03/21 [id number])    ED Course/ Medical Decision Making/ A&P                           Medical Decision  Making Amount and/or Complexity of Data Reviewed Labs: ordered. Radiology: ordered.  Risk Prescription drug management.   Left lateral abdominal pain of uncertain cause.  Consider diverticulitis, gastroenteritis, pyelonephritis, urolithiasis.  Old records are reviewed, and he has no relevant past visits, no prior abdominal imaging.  He will be sent for renal stone protocol CT of abdomen and pelvis.        Final Clinical Impression(s) / ED Diagnoses Final diagnoses:  None    Rx / DC Orders ED Discharge Orders     None         Delora Fuel, MD [id number] [phone]

## 2021-12-03 NOTE — ED Provider Notes (Addendum)
Care transferred to me.  Patient presents with left upper/lateral abdominal pain.  He has a previous history of gastric ulcer requiring surgery per him in 2018.  He is not currently on any stomach medicines.  He has some focal left upper quadrant tenderness.  CT scan has been reviewed/interpreted and there are no emergent findings including no ureteral stone.  Chart has been briefly reviewed but I cannot find the surgery he is talking about, likely occurred at an outside hospital.  Labs have been reviewed and interpreted and are essentially normal besides mild hyperglycemia.  Lipase added on, is mildly elevated but not 2-3 times upper limits of normal and so I think pancreatitis is unlikely.   Given GI cocktail here.  We will treat with PPI and Carafate at home given his prior gastric pathology and refer to local GI as he tells me he has had previous treatments in [place] but now lives here.  Stable for discharge home. UA not c/w UTI, especially with no dysuria/urine symptoms.   Pricilla Loveless, MD 12/03/21 5465    Pricilla Loveless, MD 12/03/21 [phone]

## 2022-01-23 ENCOUNTER — Encounter: Payer: Self-pay | Admitting: Internal Medicine

## 2022-03-25 ENCOUNTER — Emergency Department (HOSPITAL_COMMUNITY): Payer: Medicaid Other

## 2022-03-25 ENCOUNTER — Emergency Department (HOSPITAL_COMMUNITY)
Admission: EM | Admit: 2022-03-25 | Discharge: 2022-03-25 | Disposition: A | Discharge status: Home / Self Care | Payer: Medicaid Other | Attending: Emergency Medicine | Admitting: Emergency Medicine

## 2022-03-25 ENCOUNTER — Encounter (HOSPITAL_COMMUNITY): Payer: Self-pay

## 2022-03-25 ENCOUNTER — Other Ambulatory Visit: Payer: Self-pay

## 2022-03-25 DIAGNOSIS — M25531 Pain in right wrist: Secondary | ICD-10-CM | POA: Insufficient documentation

## 2022-03-25 DIAGNOSIS — M25561 Pain in right knee: Secondary | ICD-10-CM | POA: Diagnosis not present

## 2022-03-25 DIAGNOSIS — Y92481 Parking lot as the place of occurrence of the external cause: Secondary | ICD-10-CM | POA: Diagnosis not present

## 2022-03-25 MED ORDER — NAPROXEN 250 MG PO TABS
500.0000 mg | ORAL_TABLET | Freq: Once | ORAL | Status: AC
  Administered 2022-03-25: 500 mg via ORAL
  Filled 2022-03-25: qty 2
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
## 2022-03-25 NOTE — Discharge Instructions (Addendum)
Use Tylenol and ibuprofen for your discomfort.  You may also ice your knee and wrist.  Return with any concerns ?
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
## 2022-05-01 ENCOUNTER — Ambulatory Visit (HOSPITAL_COMMUNITY)
Admission: RE | Admit: 2022-05-01 | Discharge: 2022-05-01 | Disposition: A | Discharge status: Home / Self Care | Payer: Medicaid Other | Source: Ambulatory Visit | Attending: Nurse Practitioner | Admitting: Nurse Practitioner

## 2022-05-01 DIAGNOSIS — R079 Chest pain, unspecified: Secondary | ICD-10-CM | POA: Diagnosis present

## 2022-05-04 NOTE — Progress Notes (Signed)
Referring Provider: Ponciano Ort [facility] Primary Care Physician:  Ponciano Ort, [facility] Primary Gastroenterologist:  Dr. Marletta Lor  Chief Complaint  Patient presents with   Abdominal Pain    Heartburn and abdominal pain in the middle of stomach     HPI:   Derrick Barnes is a 36 y.o. male presenting today at the request of [facility], [facility] for "abdominal issues".  Patient was seen in the emergency room 12/03/2021 with LUQ/left lateral abdominal pain.  He reported history of gastric ulcer requiring surgery in 2018 in [place].  Focal LUQ TTP on exam.  CBC within normal limits.  CMP only with mild hyperglycemia.  Lipase slightly elevated at 69.  CT renal study with no acute findings, punctate left renal calculus, hepatic steatosis. He was prescribed Protonix 40 mg daily and carafate QID.   He had follow-up with Royann Shivers on 01/02/2022.  He was not taking his PPI for GERD as he felt the medication was not helping.  He was awaiting GI consult.  He also was waiting to see GI for history of stomach issues reporting ulcer removed and hiatal hernia repair in [place].  Notably, at the time of his visit, he did not report any abdominal pain.  Today: 4 years ago, had laparoscopic HH/antireflux surgery in [place].  Reports his heartburn symptoms did resolve, but returned about 7 months ago.  States he previously tried all PPIs which is why he had to pursue antireflux surgery.  He is currently on Protonix 40 mg daily, but is having breakthrough heartburn daily.  He has tried decreasing spicy foods with no improvement.  Also with dysphagia stating everything he eats feels like it gets stuck.  After couple of minutes, will pass.  Sometimes when he burps, items will come back up.  Also with intermittent nausea.  No vomiting.  Has not been able to vomit since his surgery 4 years ago.  Also reports abdominal pain around umbilicus radiating to RUQ. Present for more than a year. Comes and goes. Last 1-2  weeks at a time, then goes away for about 1 week. Nausea worse during these times. No specific triggers.  Mild increased bowel frequency when symptoms first start, but tapers off.  Denies having watery diarrhea.  States chronically, he has loose stools.  On a daily basis, he has bowel movements after eating which has been his baseline for at least the last 10 years.  Typically, he does not have any pain prior to a bowel movement.  He does wake in middle of the night about 4 times a week to have a bowel movement.  Reports black stools for the last 3 years that occurs here and there.  Denies BRBPR.  Denies taking Pepto-Bismol or iron.  He has been trying to lose weight.  Has started exercising.  Eats about once a day.  Has not had weight loss yet.  Has been on metformin x4 years. No new medications or recent antibiotics.   NSAIDs:  Ibuprofen- has cut back. Was taking 5 pills every 6 hours. Now taking 3 every 12 hours. Right knee pain. Had a torn meniscus.  Had surgery but fell after and now has severe arthritis.    Past Medical History:  Diagnosis Date   Bipolar disorder (HCC)    Diabetes mellitus without complication (HCC)    GERD (gastroesophageal reflux disease)    History of COVID-19 11/21/2020   Hypertension    Sleep apnea     Past Surgical  History:  Procedure Laterality Date   ABDOMINAL SURGERY     HERNIA REPAIR     2018 or 2019 in [place]   KNEE ARTHROSCOPY Right 01/22/2021   Procedure: RIGHT KNEE ARTHROSCOPY AND DEBRIDEMENT;  Surgeon: Nadara Mustard, MD;  Location: [facility];  Service: Orthopedics;  Laterality: Right;    Current Outpatient Medications  Medication Sig Dispense Refill   albuterol (VENTOLIN HFA) 108 (90 Base) MCG/ACT inhaler Inhale 1-2 puffs into the lungs every 6 (six) hours as needed for wheezing or shortness of breath. 18 g 0   ibuprofen (ADVIL) 800 MG tablet Take 800 mg by mouth every 8 (eight) hours as needed.     losartan (COZAAR) 50 MG tablet  Take 50 mg by mouth daily.     metFORMIN (GLUCOPHAGE) 500 MG tablet Take by mouth 2 (two) times daily with a meal.     pantoprazole (PROTONIX) 40 MG tablet Take 1 tablet (40 mg total) by mouth 2 (two) times daily before a meal. 60 tablet 5   No current facility-administered medications for this visit.    Allergies as of 05/05/2022 - Review Complete 05/05/2022  Allergen Reaction Noted   Alfalfa Cough 07/05/2020   Trulicity [dulaglutide] Rash 04/15/2020    Family History  Problem Relation Age of Onset   Diabetes Father    Cancer Father    Heart disease Father    Colon cancer Paternal Uncle        53   Inflammatory bowel disease Neg Hx    Celiac disease Neg Hx     Social History   Socioeconomic History   Marital status: Single    Spouse name: Not on file   Number of children: Not on file   Years of education: Not on file   Highest education level: Not on file  Occupational History   Not on file  Tobacco Use   Smoking status: Every Day    Packs/day: 0.25    Types: Cigarettes   Smokeless tobacco: Never  Vaping Use   Vaping Use: Never used  Substance and Sexual Activity   Alcohol use: Not Currently    Comment: occ   Drug use: Not Currently   Sexual activity: Not on file  Other Topics Concern   Not on file  Social History Narrative   ** Merged History Encounter **       Social Determinants of Health   Financial Resource Strain: Not on file  Food Insecurity: Not on file  Transportation Needs: Not on file  Physical Activity: Not on file  Stress: Not on file  Social Connections: Not on file  Intimate Partner Violence: Not on file    Review of Systems: Gen: Denies any fever, chills, cold or flulike symptoms, presyncope, syncope. Lightheadedness for the last few weeks.  CV:  Low CP radiating to RUQ.  Resp: Chronic mild SOB and intermittent cough.  GI: See HPI GU : Denies urinary burning, urinary frequency, urinary hesitancy MS: Denies joint pain. Derm: Denies  rash. Psych: Denies depression, anxiety. Heme: See HPI  Physical Exam: BP (!) 153/95   Pulse 81   Temp 97.7 F (36.5 C) (Temporal)   Ht 6\' 3"  (1.905 m)   Wt (!) 368 lb 12.8 oz (167.3 kg)   BMI 46.10 kg/m  General:   Alert and oriented. Pleasant and cooperative. Well-nourished and well-developed.  Head:  Normocephalic and atraumatic. Eyes:  Without icterus, sclera clear and conjunctiva pink.  Ears:  Normal auditory acuity.  Lungs:  Clear to auscultation bilaterally. No wheezes, rales, or rhonchi. No distress.  Heart:  S1, S2 present without murmurs appreciated.  Abdomen:  +BS, soft, and non-distended. Mild abdominal tenderness in RUQ, epigastric, just above umbilicus, and in RLQ.  No HSM noted. No guarding or rebound. No masses appreciated.  Rectal:  Deferred  Msk:  Symmetrical without gross deformities. Normal posture. Extremities:  Without edema. Neurologic:  Alert and  oriented x4;  grossly normal neurologically. Skin:  Intact without significant lesions or rashes. Psych:  Normal mood and affect.    Assessment:  36 year old male with history of HTN, diabetes, bipolar disorder, GERD, reported history of gastric ulcers, reported HH/antireflux surgery in [place] about 4 years ago, presenting today at request of Royann Shivers, NP for "abdominal issues". Patient with chief complaint of uncontrolled heartburn, abdominal pain, dysphagia, black stool, and also with chronic loose stools.  Heartburn: Chronic.  Symptoms previously resolved following laparoscopic HH/antireflux surgery about 4 years ago in [place], but returned about 7 months ago.  Currently on Protonix 40 mg daily, but continues with daily breakthrough.  Reports previously trying essentially all PPIs prior to antireflux surgery.  I will increase his Protonix to twice daily and arrange EGD in the near future for further evaluation.   Dysphagia: Patient reports daily sensation of essentially everything he eats getting stuck in  his esophagus.  Usually passes within a couple of minutes, but occasionally will burp and regurgitate items.  Symptoms may be secondary to uncontrolled GERD discussed above, esophageal web, ring, stricture. Unclear if prior antireflux/HH surgery may be contributing to his symptoms.  We will arrange for an EGD in the near future.  Abdominal pain:  Greater than 1 year history of intermittent abdominal pain just above the umbilicus radiating to the right upper quadrant without specific triggers, lasting 1-2 weeks at a time, then resolving for about 1 week.  Reports intermittent nausea that worsens during these times as well as increase in bowel frequency initially that tapers off within a couple of days.  Also with uncontrolled heartburn for the last 7 months.  Denies brbpr but reports intermittent black stools for 3 years. Takes ibuprofen chronically for knee pain. CT renal study in January 2023 without any acute abnormalities.  No significant laboratory abnormalities, hemoglobin, LFTs, lipase normal.  Abdominal exam today with mild tenderness in RUQ, epigastric region, just above umbilicus, and in RLQ without rebound or guarding. Differentials include PUD, H pylori, gastritis, esophagitis, duodenitis, uncontrolled GERD, biliary dyskinesia, IBS, celiac disease. We will update labs and arrange EGD in the near future. I will also increase PPI to BID dosing.   Loose stools:  Chronic history of postprandial loose stools x 10 + years.  Eats about 1 meal a day to help reduce bowel frequency, but reports if he would have about 6 Bms if eating 3 meals a day. Also reports nocturnal stools about 4 nights a week.  Denies unintentional weight loss or associated abdominal pain prior to a BM typically, but does report increased bowel frequency when having flares of periumbilical/upper abdominal pain discussed above.  No BRBPR, but reports intermittent black stools x3 years.  No family history of IBD or celiac disease.  Paternal uncle with colon cancer at age 37. CT renal study in January with no significant abnormalities.  Differentials include IBS, thyroid abnormalities, celiac disease, IBD, less consistent with infectious etiology, but with nocturnal stools, can't rule this out. Will plan to complete stool studies, screen for celiac disease, check  thyroid function, and check inflammatory markers. May need to consider colonoscopy in the near future.  Black stools: Intermittent black stools x3 years.  Denies Pepto-Bismol.  Notably, hemoglobin was within normal limits in January 2023.  I am concerned about peptic ulcer disease in the setting of chronic daily NSAID use.  We are arranging for an EGD in the near future.  Also updating CBC.   Plan:  CBC, CMP, lipase, IgA, TTG IgA, TSH, CRP, ESR, C. difficile GDH and toxin A/B, GI pathogen panel. Proceed with upper endoscopy with propofol by Dr. Marletta Lor in near future. The risks, benefits, and alternatives have been discussed with the patient in detail. The patient states understanding and desires to proceed. ASA 3 Hold metformin morning of procedure Increase Protonix to 40 mg twice daily. Counseled on GERD diet/lifestyle.  Separate written instructions provided. Decrease use of ibuprofen is much as possible.  Avoid all other NSAIDs. Use Tylenol as needed to help with knee pain, no more than 3000 mg per 24 hours. Encourage patient to discuss prescription for Voltaren gel with PCP. Follow-up after EGD.    Ermalinda Memos, PA-C [facility] Gastroenterology 05/05/2022

## 2022-05-05 ENCOUNTER — Ambulatory Visit (INDEPENDENT_AMBULATORY_CARE_PROVIDER_SITE_OTHER): Payer: Medicaid Other | Admitting: Gastroenterology

## 2022-05-05 ENCOUNTER — Encounter: Payer: Self-pay | Admitting: Gastroenterology

## 2022-05-05 VITALS — BP 153/95 | HR 81 | Temp 97.7°F | Ht 75.0 in | Wt 368.8 lb

## 2022-05-05 DIAGNOSIS — R12 Heartburn: Secondary | ICD-10-CM | POA: Diagnosis not present

## 2022-05-05 DIAGNOSIS — R1310 Dysphagia, unspecified: Secondary | ICD-10-CM | POA: Diagnosis not present

## 2022-05-05 DIAGNOSIS — K921 Melena: Secondary | ICD-10-CM

## 2022-05-05 DIAGNOSIS — R1010 Upper abdominal pain, unspecified: Secondary | ICD-10-CM | POA: Diagnosis not present

## 2022-05-05 DIAGNOSIS — R195 Other fecal abnormalities: Secondary | ICD-10-CM | POA: Insufficient documentation

## 2022-05-05 MED ORDER — PANTOPRAZOLE SODIUM 40 MG PO TBEC: 40.0000 mg | DELAYED_RELEASE_TABLET | Freq: Two times a day (BID) | ORAL | 5 refills | Status: DC

## 2022-05-06 ENCOUNTER — Telehealth: Payer: Self-pay | Admitting: *Deleted

## 2022-05-07 ENCOUNTER — Encounter: Payer: Self-pay | Admitting: Emergency Medicine

## 2022-05-07 ENCOUNTER — Ambulatory Visit
Admission: EM | Admit: 2022-05-07 | Discharge: 2022-05-07 | Disposition: A | Discharge status: Home / Self Care | Payer: Medicaid Other | Attending: Nurse Practitioner | Admitting: Nurse Practitioner

## 2022-05-07 DIAGNOSIS — J45901 Unspecified asthma with (acute) exacerbation: Secondary | ICD-10-CM

## 2022-05-07 DIAGNOSIS — R0989 Other specified symptoms and signs involving the circulatory and respiratory systems: Secondary | ICD-10-CM

## 2022-05-07 LAB — POCT RAPID STREP A (OFFICE): Rapid Strep A Screen: NEGATIVE

## 2022-05-07 MED ORDER — LIDOCAINE VISCOUS HCL 2 % MT SOLN: 10.0000 mL | Freq: Four times a day (QID) | OROMUCOSAL | 0 refills | Status: DC | PRN

## 2022-05-07 MED ORDER — PREDNISONE 20 MG PO TABS: 20.0000 mg | ORAL_TABLET | Freq: Every day | ORAL | 0 refills | Status: AC

## 2022-05-07 MED ORDER — PSEUDOEPH-BROMPHEN-DM 30-2-10 MG/5ML PO SYRP: 5.0000 mL | ORAL_SOLUTION | Freq: Four times a day (QID) | ORAL | 0 refills | Status: DC | PRN

## 2022-05-07 MED ORDER — ALBUTEROL SULFATE HFA 108 (90 BASE) MCG/ACT IN AERS: 2.0000 | INHALATION_SPRAY | Freq: Four times a day (QID) | RESPIRATORY_TRACT | 0 refills | Status: DC | PRN

## 2022-05-07 NOTE — Discharge Instructions (Signed)
The rapid strep test was negative.  A throat culture and COVID/flu result is pending.  You will be contacted if either of those results are positive. Take medication as prescribed. You have been prescribed prednisone for your asthma symptoms.  Please monitor your blood glucose levels while taking the prednisone as this can elevate your blood glucose.  If your blood sugars become elevated, please stop the prednisone immediately. Warm salt water gargles 3-4 times daily while you are sore throat persist. May take over-the-counter ibuprofen or Tylenol to help with pain, fever, or general discomfort. Follow-up in the emergency department if you develop shortness of breath, difficulty breathing, inability to speak in a complete sentence, or other concerns. Follow-up in this clinic or with your primary care physician if symptoms do not improve.

## 2022-05-07 NOTE — ED Triage Notes (Signed)
Pt is present today with concerns for a sore throat and cough. Pt sx started x2 days ago

## 2022-05-07 NOTE — ED Provider Notes (Signed)
[facility]    CSN: [id number] Arrival date & time: 05/07/22  1134      History   Chief Complaint Chief Complaint  Patient presents with   Sore Throat   Cough    HPI Derrick Barnes is a 36 y.o. male.   The history is provided by the patient.   Patient presents for complaints of cough and sore throats been present for the past 2- 3 days.  He also complains of nasal congestion and wheezing.  He also states he had a fever of 101 last evening.  He denies headache, ear pain, difficulty breathing, or GI symptoms.  Patient states that he has a history of asthma.  States that he has been using his inhaler 4 times per day since his symptoms started.  Past Medical History:  Diagnosis Date   Bipolar disorder (HCC)    Diabetes mellitus without complication (HCC)    GERD (gastroesophageal reflux disease)    History of COVID-19 11/21/2020   Hypertension    Sleep apnea     Patient Active Problem List   Diagnosis Date Noted   Black stool 05/05/2022   Dysphagia 05/05/2022   Heartburn 05/05/2022   Pain of upper abdomen 05/05/2022   Loose stools 05/05/2022   Low back pain 05/07/2021   Unilateral primary osteoarthritis, right knee     Past Surgical History:  Procedure Laterality Date   ABDOMINAL SURGERY     HERNIA REPAIR     2018 or 2019 in [place]   KNEE ARTHROSCOPY Right 01/22/2021   Procedure: RIGHT KNEE ARTHROSCOPY AND DEBRIDEMENT;  Surgeon: Nadara Mustard, MD;  [facility];  Service: Orthopedics;  Laterality: Right;       Home Medications    Prior to Admission medications   Medication Sig Start Date End Date Taking? Authorizing Provider  albuterol (VENTOLIN HFA) 108 (90 Base) MCG/ACT inhaler Inhale 2 puffs into the lungs every 6 (six) hours as needed for wheezing or shortness of breath. 05/07/22  Yes Falecia Vannatter-Warren, Sadie Haber, NP  lidocaine (XYLOCAINE) 2 % solution Use as directed 10 mLs in the mouth or throat every 6 (six) hours as needed  for mouth pain. 05/07/22  Yes Jovonne Wilton-Warren, Sadie Haber, NP  predniSONE (DELTASONE) 20 MG tablet Take 1 tablet (20 mg total) by mouth daily with breakfast for 5 days. 05/07/22 05/12/22 Yes Chakita Mcgraw-Warren, Sadie Haber, NP  ibuprofen (ADVIL) 800 MG tablet Take 800 mg by mouth every 8 (eight) hours as needed.    [provider]  losartan (COZAAR) 50 MG tablet Take 50 mg by mouth daily. 11/25/21   [provider]  metFORMIN (GLUCOPHAGE) 500 MG tablet Take by mouth 2 (two) times daily with a meal.    [provider]  pantoprazole (PROTONIX) 40 MG tablet Take 1 tablet (40 mg total) by mouth 2 (two) times daily before a meal. 05/05/22   Letta Median, PA-C    Family History Family History  Problem Relation Age of Onset   Diabetes Father    Cancer Father    Heart disease Father    Colon cancer Paternal Uncle        43   Inflammatory bowel disease Neg Hx    Celiac disease Neg Hx     Social History Social History   Tobacco Use   Smoking status: Every Day    Packs/day: 0.25    Types: Cigarettes   Smokeless tobacco: Never  Vaping Use   Vaping Use: Never  used  Substance Use Topics   Alcohol use: Not Currently    Comment: occ   Drug use: Not Currently     Allergies   Alfalfa and Trulicity [dulaglutide]   Review of Systems Review of Systems Per HPI  Physical Exam Triage Vital Signs ED Triage Vitals [05/07/22 1349]  Enc Vitals Group     BP (!) 163/115     Pulse Rate 84     Resp 20     Temp 98 F (36.7 C)     Temp src      SpO2 95 %     Weight      Height      Head Circumference      Peak Flow      Pain Score 7     Pain Loc      Pain Edu?      Excl. in GC?    No data found.  Updated Vital Signs BP (!) 147/109 (BP Location: Right Arm)   Pulse 84   Temp 98 F (36.7 C)   Resp 20   SpO2 95%   Visual Acuity Right Eye Distance:   Left Eye Distance:   Bilateral Distance:    Right Eye Near:   Left Eye Near:    Bilateral Near:      Physical Exam Vitals and nursing note reviewed.  Constitutional:      General: He is not in acute distress.    Appearance: He is well-developed.  HENT:     Head: Normocephalic.     Right Ear: Tympanic membrane and ear canal normal.     Left Ear: Tympanic membrane and ear canal normal.     Nose: Congestion and rhinorrhea present.     Mouth/Throat:     Mouth: Mucous membranes are moist.     Pharynx: Pharyngeal swelling and posterior oropharyngeal erythema present.     Tonsils: No tonsillar exudate. 1+ on the right. 1+ on the left.  Eyes:     Conjunctiva/sclera: Conjunctivae normal.     Pupils: Pupils are equal, round, and reactive to light.  Cardiovascular:     Rate and Rhythm: Normal rate and regular rhythm.     Heart sounds: Normal heart sounds.  Pulmonary:     Effort: Pulmonary effort is normal.     Breath sounds: Wheezing (posterior lower lobes) present.  Abdominal:     General: Bowel sounds are normal.     Palpations: Abdomen is soft.  Musculoskeletal:     Cervical back: Normal range of motion.  Lymphadenopathy:     Cervical: No cervical adenopathy.  Skin:    General: Skin is warm and dry.  Neurological:     General: No focal deficit present.     Mental Status: He is alert and oriented to person, place, and time.  Psychiatric:        Mood and Affect: Mood normal.        Behavior: Behavior normal.      UC Treatments / Results  Labs (all labs ordered are listed, but only abnormal results are displayed) Labs Reviewed  COVID-19, FLU A+B NAA  CULTURE, GROUP A STREP [facility])  POCT RAPID STREP A (OFFICE)    EKG   Radiology No results found.  Procedures Procedures (including critical care time)  Medications Ordered in UC Medications - No data to display  Initial Impression / Assessment and Plan / UC Course  I have reviewed the triage vital signs and the nursing notes.  Pertinent labs & imaging results that were available during my care of the patient  were reviewed by me and considered in my medical decision making (see chart for details).  Patient presents for cough and upper respiratory symptoms that been present for the past 2-3 days.  On exam, patient has wheezing in his posterior bilateral lower lobes.  No exudate or cervical adenopathy present.  Patient's blood pressure is elevated, but he does have a history of hypertension.  Rapid strep test is negative, throat culture and COVID/flu test are pending.  Symptomatic treatment was provided to include viscous lidocaine.  For his asthma symptoms, will recommend continued use of his albuterol inhaler.  We will also provide a short course of prednisone.  Patient was advised to monitor his blood glucose levels while he is on the medication.  Supportive care recommendations were provided to the patient.  Patient advised to follow-up as needed or if symptoms do not improve. Final Clinical Impressions(s) / UC Diagnoses   Final diagnoses:  Symptoms of upper respiratory infection (URI)  Asthma with acute exacerbation, unspecified asthma severity, unspecified whether persistent     Discharge Instructions      The rapid strep test was negative.  A throat culture and COVID/flu result is pending.  You will be contacted if either of those results are positive. Take medication as prescribed. You have been prescribed prednisone for your asthma symptoms.  Please monitor your blood glucose levels while taking the prednisone as this can elevate your blood glucose.  If your blood sugars become elevated, please stop the prednisone immediately. Warm salt water gargles 3-4 times daily while you are sore throat persist. May take over-the-counter ibuprofen or Tylenol to help with pain, fever, or general discomfort. Follow-up in the emergency department if you develop shortness of breath, difficulty breathing, inability to speak in a complete sentence, or other concerns. Follow-up in this clinic or with your primary  care physician if symptoms do not improve.     ED Prescriptions     Medication Sig Dispense Auth. Provider   predniSONE (DELTASONE) 20 MG tablet Take 1 tablet (20 mg total) by mouth daily with breakfast for 5 days. 5 tablet Baker Kogler-Warren, Alda Lea, NP   lidocaine (XYLOCAINE) 2 % solution Use as directed 10 mLs in the mouth or throat every 6 (six) hours as needed for mouth pain. 100 mL Renell Allum-Warren, Alda Lea, NP   albuterol (VENTOLIN HFA) 108 (90 Base) MCG/ACT inhaler Inhale 2 puffs into the lungs every 6 (six) hours as needed for wheezing or shortness of breath. 8 g Jeson Camacho-Warren, Alda Lea, NP      PDMP not reviewed this encounter.   Tish Men, NP 05/07/22 1501

## 2022-05-07 NOTE — Telephone Encounter (Signed)
Called pt, VM not set up. Letter mailed.

## 2022-05-08 LAB — COVID-19, FLU A+B NAA
Influenza A, NAA: NOT DETECTED
Influenza B, NAA: NOT DETECTED
SARS-CoV-2, NAA: NOT DETECTED

## 2022-05-10 LAB — CULTURE, GROUP A STREP (THRC)

## 2022-05-14 ENCOUNTER — Telehealth: Payer: Self-pay | Admitting: Internal Medicine

## 2022-05-14 NOTE — Telephone Encounter (Signed)
Called pt, VM not set up

## 2022-05-14 NOTE — Telephone Encounter (Signed)
PATIENT RECEIVED LETTER TO SCHEDULE TCS °

## 2022-05-15 NOTE — Telephone Encounter (Signed)
Called pt, VM not set up. Not able to leave message

## 2022-06-26 ENCOUNTER — Emergency Department (HOSPITAL_COMMUNITY)
Admission: EM | Admit: 2022-06-26 | Discharge: 2022-06-27 | Disposition: A | Discharge status: Home / Self Care | Payer: Medicaid Other | Attending: Emergency Medicine | Admitting: Emergency Medicine

## 2022-06-26 ENCOUNTER — Encounter (HOSPITAL_COMMUNITY): Payer: Self-pay

## 2022-06-26 ENCOUNTER — Other Ambulatory Visit: Payer: Self-pay

## 2022-06-26 DIAGNOSIS — R21 Rash and other nonspecific skin eruption: Secondary | ICD-10-CM | POA: Insufficient documentation

## 2022-06-26 DIAGNOSIS — E119 Type 2 diabetes mellitus without complications: Secondary | ICD-10-CM | POA: Insufficient documentation

## 2022-06-26 DIAGNOSIS — I10 Essential (primary) hypertension: Secondary | ICD-10-CM | POA: Insufficient documentation

## 2022-06-26 DIAGNOSIS — R1033 Periumbilical pain: Secondary | ICD-10-CM | POA: Insufficient documentation

## 2022-06-26 DIAGNOSIS — Z8616 Personal history of COVID-19: Secondary | ICD-10-CM | POA: Insufficient documentation

## 2022-06-26 DIAGNOSIS — F17210 Nicotine dependence, cigarettes, uncomplicated: Secondary | ICD-10-CM | POA: Diagnosis not present

## 2022-06-26 NOTE — ED Triage Notes (Signed)
Pt presents to ED from home with c/o abdominal pain (periumbilical) that started 3 days, reports pain is constant pressure with stabbing pains, pt also has rash (whelps or hives) to abdomen that started yesterday- pt says it itches. Last BM was today and reported "more solid than normal"

## 2022-06-27 ENCOUNTER — Emergency Department (HOSPITAL_COMMUNITY): Payer: Medicaid Other

## 2022-06-27 LAB — COMPREHENSIVE METABOLIC PANEL
ALT: 29 U/L (ref 0–44)
AST: 21 U/L (ref 15–41)
Albumin: 3.9 g/dL (ref 3.5–5.0)
Alkaline Phosphatase: 87 U/L (ref 38–126)
Anion gap: 5 (ref 5–15)
BUN: 9 mg/dL (ref 6–20)
CO2: 25 mmol/L (ref 22–32)
Calcium: 9 mg/dL (ref 8.9–10.3)
Chloride: 109 mmol/L (ref 98–111)
Creatinine, Ser: 0.91 mg/dL (ref 0.61–1.24)
GFR, Estimated: >60 mL/min (ref >60)
Glucose, Bld: 134 mg/dL — ABNORMAL HIGH (ref 70–99)
Potassium: 4 mmol/L (ref 3.5–5.1)
Sodium: 139 mmol/L (ref 135–145)
Total Bilirubin: 0.4 mg/dL (ref 0.3–1.2)
Total Protein: 7.1 g/dL (ref 6.5–8.1)

## 2022-06-27 LAB — CBC
HCT: 41.7 % (ref 39.0–52.0)
Hemoglobin: 14.5 g/dL (ref 13.0–17.0)
MCH: 30.3 pg (ref 26.0–34.0)
MCHC: 34.8 g/dL (ref 30.0–36.0)
MCV: 87.1 fL (ref 80.0–100.0)
Platelets: 193 10*3/uL (ref 150–400)
RBC: 4.79 MIL/uL (ref 4.22–5.81)
RDW: 12.5 % (ref 11.5–15.5)
WBC: 6 10*3/uL (ref 4.0–10.5)
nRBC: 0 % (ref 0.0–0.2)

## 2022-06-27 LAB — URINALYSIS, ROUTINE W REFLEX MICROSCOPIC
Bilirubin Urine: NEGATIVE
Glucose, UA: NEGATIVE mg/dL
Hgb urine dipstick: NEGATIVE
Ketones, ur: NEGATIVE mg/dL
Leukocytes,Ua: NEGATIVE
Nitrite: NEGATIVE
Protein, ur: NEGATIVE mg/dL
Specific Gravity, Urine: 1.015 (ref 1.005–1.030)
pH: 6 (ref 5.0–8.0)

## 2022-06-27 LAB — LIPASE, BLOOD: Lipase: 57 U/L — ABNORMAL HIGH (ref 11–51)

## 2022-06-27 MED ORDER — IOHEXOL 300 MG/ML  SOLN
100.0000 mL | Freq: Once | INTRAMUSCULAR | Status: AC | PRN
  Administered 2022-06-27: 100 mL via INTRAVENOUS

## 2022-06-27 MED ORDER — DIPHENHYDRAMINE HCL 50 MG/ML IJ SOLN
25.0000 mg | Freq: Once | INTRAMUSCULAR | Status: AC
  Administered 2022-06-27: 25 mg via INTRAVENOUS
  Filled 2022-06-27: qty 1

## 2022-06-27 MED ORDER — DIPHENHYDRAMINE HCL 25 MG PO TABS: 25.0000 mg | ORAL_TABLET | Freq: Four times a day (QID) | ORAL | 0 refills | Status: DC

## 2022-06-27 MED ORDER — SODIUM CHLORIDE 0.9 % IV BOLUS
1000.0000 mL | Freq: Once | INTRAVENOUS | Status: AC
  Administered 2022-06-27: 1000 mL via INTRAVENOUS

## 2022-06-27 MED ORDER — PROCHLORPERAZINE EDISYLATE 10 MG/2ML IJ SOLN
10.0000 mg | Freq: Once | INTRAMUSCULAR | Status: AC
  Administered 2022-06-27: 10 mg via INTRAVENOUS
  Filled 2022-06-27: qty 2

## 2022-06-27 MED ORDER — HYDROCORTISONE 1 % EX CREA: TOPICAL_CREAM | CUTANEOUS | 0 refills | Status: DC

## 2022-06-27 NOTE — ED Provider Notes (Signed)
AP-EMERGENCY DEPT [facility] Emergency Department Provider Note MRN:  [id number]  Arrival date & time: 06/27/22     Chief Complaint   Abdominal Pain   History of Present Illness   Derrick Barnes is a 36 y.o. year-old male with a history of diabetes, bipolar disorder presenting to the ED with chief complaint of abdominal pain.  Pain to the periumbilical region for the past 3 days, constant.  Also has a rash to the abdomen on both sides for a week or so.  Rash is itchy.  No new soaps or detergents or exposures.  No fever, nausea but no vomiting, no diarrhea or constipation.  Review of Systems  A thorough review of systems was obtained and all systems are negative except as noted in the HPI and PMH.   Patient's Health History    Past Medical History:  Diagnosis Date   Bipolar disorder (HCC)    Diabetes mellitus without complication (HCC)    GERD (gastroesophageal reflux disease)    History of COVID-19 11/21/2020   Hypertension    Sleep apnea     Past Surgical History:  Procedure Laterality Date   ABDOMINAL SURGERY     HERNIA REPAIR     2018 or 2019 in [place]   KNEE ARTHROSCOPY Right 01/22/2021   Procedure: RIGHT KNEE ARTHROSCOPY AND DEBRIDEMENT;  Surgeon: Nadara Mustard, MD;  Location: [facility];  Service: Orthopedics;  Laterality: Right;    Family History  Problem Relation Age of Onset   Diabetes Father    Cancer Father    Heart disease Father    Colon cancer Paternal Uncle        56   Inflammatory bowel disease Neg Hx    Celiac disease Neg Hx     Social History   Socioeconomic History   Marital status: Single    Spouse name: Not on file   Number of children: Not on file   Years of education: Not on file   Highest education level: Not on file  Occupational History   Not on file  Tobacco Use   Smoking status: Every Day    Packs/day: 0.25    Types: Cigarettes   Smokeless tobacco: Never  Vaping Use   Vaping Use: Never used  Substance  and Sexual Activity   Alcohol use: Not Currently    Comment: occ   Drug use: Not Currently   Sexual activity: Not on file  Other Topics Concern   Not on file  Social History Narrative   ** Merged History Encounter **       Social Determinants of Health   Financial Resource Strain: Not on file  Food Insecurity: Not on file  Transportation Needs: Not on file  Physical Activity: Not on file  Stress: Not on file  Social Connections: Not on file  Intimate Partner Violence: Not on file     Physical Exam   Vitals:   06/27/22 0100 06/27/22 0130  BP: (!) 143/101 (!) 156/105  Pulse: 71 70  Resp: 18 18  Temp:    SpO2: 98% 98%    CONSTITUTIONAL: Well-appearing, NAD NEURO/PSYCH:  Alert and oriented x 3, no focal deficits EYES:  eyes equal and reactive ENT/NECK:  no LAD, no JVD CARDIO: Regular rate, well-perfused, normal S1 and S2 PULM:  CTAB no wheezing or rhonchi GI/GU:  non-distended, non-tender MSK/SPINE:  No gross deformities, no edema SKIN: Macular rash to the abdomen   *Additional and/or pertinent findings included in MDM  below  Diagnostic and Interventional Summary    EKG Interpretation  Date/Time:    Ventricular Rate:    PR Interval:    QRS Duration:   QT Interval:    QTC Calculation:   R Axis:     Text Interpretation:         Labs Reviewed  COMPREHENSIVE METABOLIC PANEL - Abnormal; Notable for the following components:      Result Value   Glucose, Bld 134 (*)    All other components within normal limits  LIPASE, BLOOD - Abnormal; Notable for the following components:   Lipase 57 (*)    All other components within normal limits  CBC  URINALYSIS, ROUTINE W REFLEX MICROSCOPIC    CT ABDOMEN PELVIS W CONTRAST  Final Result      Medications  sodium chloride 0.9 % bolus 1,000 mL (0 mLs Intravenous Stopped 06/27/22 0159)  diphenhydrAMINE (BENADRYL) injection 25 mg (25 mg Intravenous Given 06/27/22 0023)  prochlorperazine (COMPAZINE) injection 10 mg (10  mg Intravenous Given 06/27/22 0022)  iohexol (OMNIPAQUE) 300 MG/ML solution 100 mL (100 mLs Intravenous Contrast Given 06/27/22 0109)     Procedures  /  Critical Care Procedures  ED Course and Medical Decision Making  Initial Impression and Ddx Differential diagnosis includes appendicitis, small bowel obstruction as patient has had less bowel movements than normal recently.  Rash seems more allergic rather than infectious.  Not vesicular, bilateral.  Awaiting labs, CT.  Past medical/surgical history that increases complexity of ED encounter: None  Interpretation of Diagnostics I personally reviewed the laboratory assessment and my interpretation is as follows: No significant blood count or electrolyte disturbance, lipase minimally elevated of unclear significance.  CT imaging reassuring, no emergent process or signs of other pathology.  Patient Reassessment and Ultimate Disposition/Management     Patient is appropriate for discharge, will trial Benadryl and hydrocortisone cream for the rash.  Return precautions.  Patient management required discussion with the following services or consulting groups:  None  Complexity of Problems Addressed Acute illness or injury that poses threat of life of bodily function  Additional Data Reviewed and Analyzed Further history obtained from: None  Additional Factors Impacting ED Encounter Risk Prescriptions  Elmer Sow. Pilar Plate, MD [facility] Emergency Medicine [facility] [email or web address]  Final Clinical Impressions(s) / ED Diagnoses     ICD-10-CM   1. Periumbilical abdominal pain  R10.33     2. Rash  R21       ED Discharge Orders          Ordered    diphenhydrAMINE (BENADRYL) 25 MG tablet  Every 6 hours        06/27/22 0204    hydrocortisone cream 1 %        06/27/22 0204             Discharge Instructions Discussed with and Provided to Patient:    Discharge Instructions      You were evaluated in  the Emergency Department and after careful evaluation, we did not find any emergent condition requiring admission or further testing in the hospital.  Your exam/testing today is overall reassuring.  Recommend using the Benadryl every 4-6 hours as needed for itching.  Also recommend using the hydrocortisone cream once or twice daily to see if this helps her rash.  Please return to the Emergency Department if you experience any worsening of your condition.   Thank you for allowing [place] to be a part of your care.  Maudie Flakes, MD 06/27/22 [phone]

## 2022-06-27 NOTE — Discharge Instructions (Signed)
You were evaluated in the Emergency Department and after careful evaluation, we did not find any emergent condition requiring admission or further testing in the hospital.  Your exam/testing today is overall reassuring.  Recommend using the Benadryl every 4-6 hours as needed for itching.  Also recommend using the hydrocortisone cream once or twice daily to see if this helps her rash.  Please return to the Emergency Department if you experience any worsening of your condition.   Thank you for allowing [place] to be a part of your care.

## 2022-07-24 IMAGING — CT CT RENAL STONE PROTOCOL
2 of 4 series · 16 of 46 positions shown, 18 images · non-contrast
Comparison: None.

CLINICAL DATA: Flank pain with kidney stone suspected



[Series 2: axial st · axial · 0.85mm/px · z∈[+937,+1437]mm · 13 of 116 slices shown, 15 images]
[im 8/116  soft-tissue]
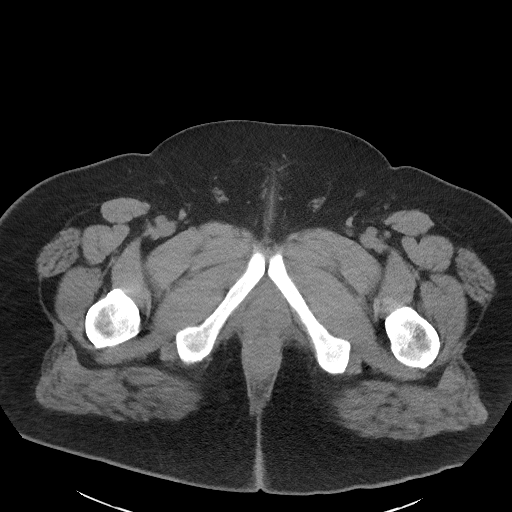
[im 8/116  bone]
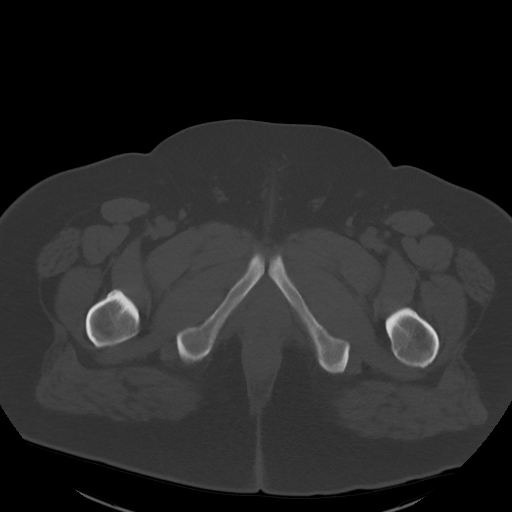
[im 16/116  soft-tissue]
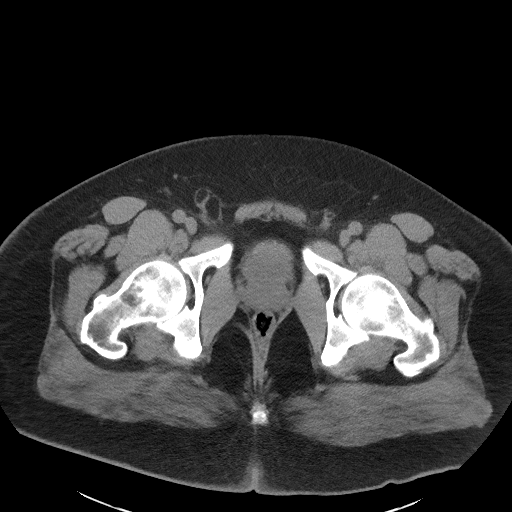
[im 24/116  soft-tissue]
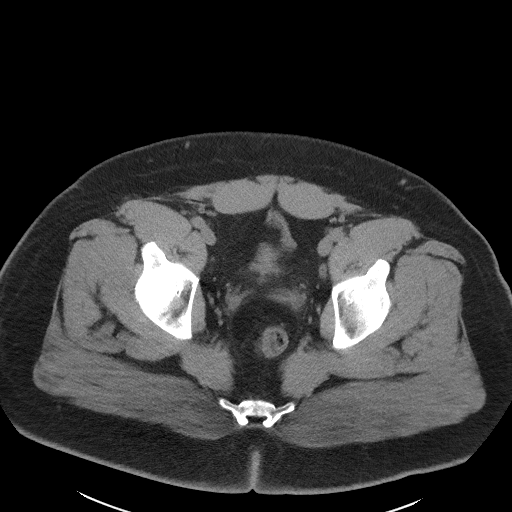
[im 31/116  soft-tissue]
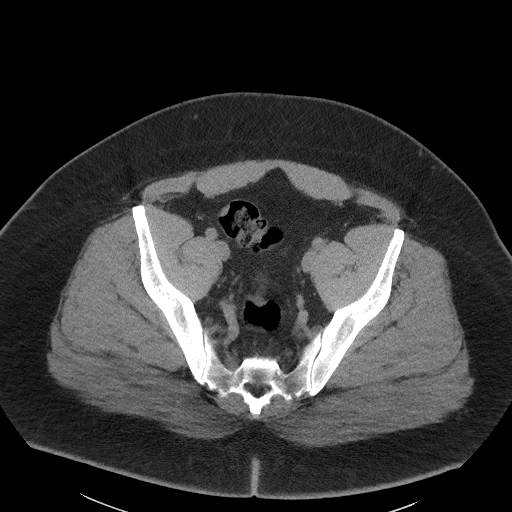
[im 39/116  soft-tissue]
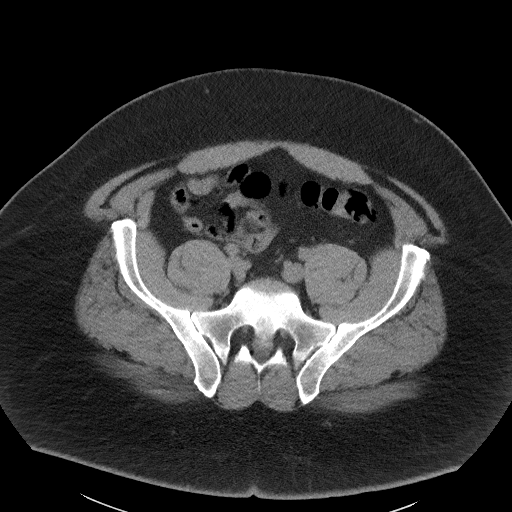
[im 47/116  soft-tissue]
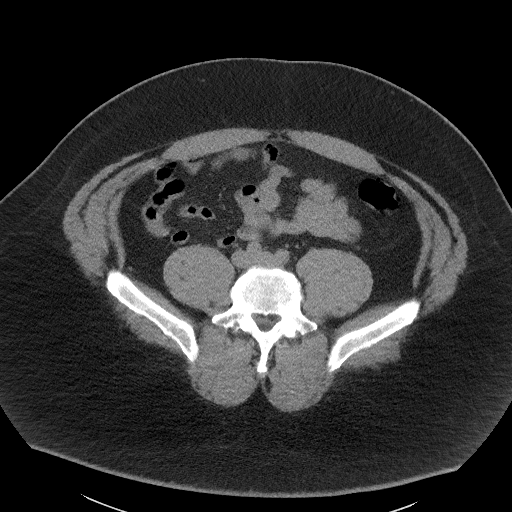
[im 62/116  soft-tissue]
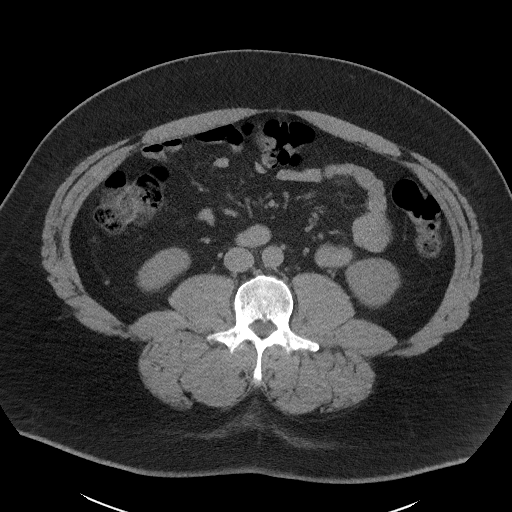
[im 70/116  soft-tissue]
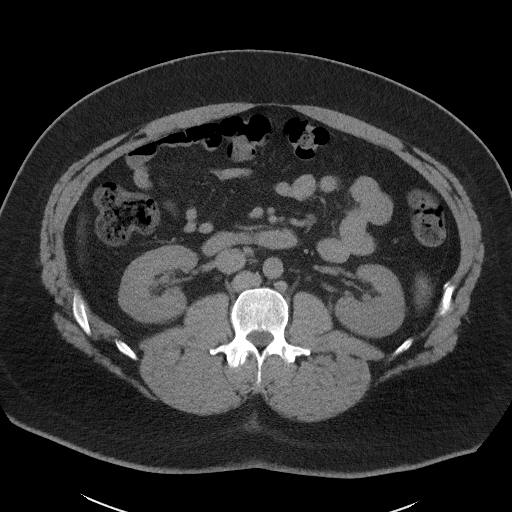
[im 77/116  soft-tissue]
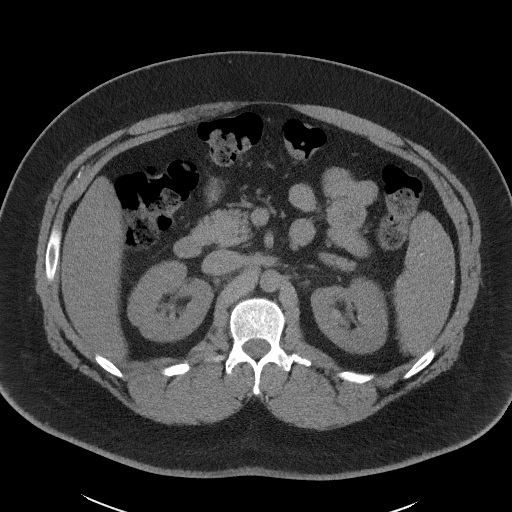
[im 77/116  bone]
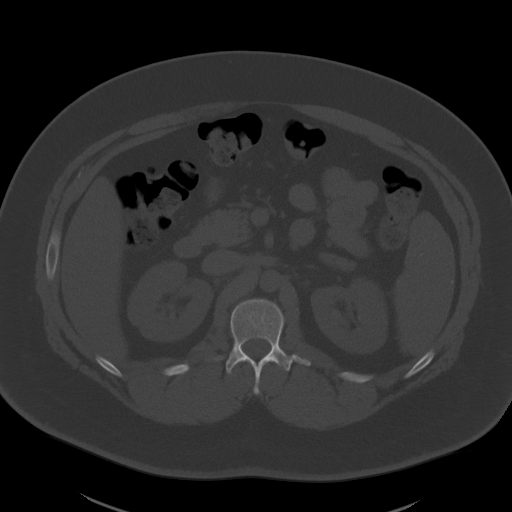
[im 85/116  soft-tissue]
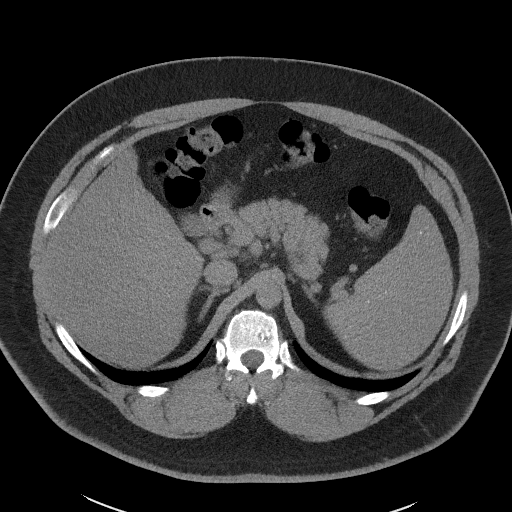
[im 93/116  soft-tissue]
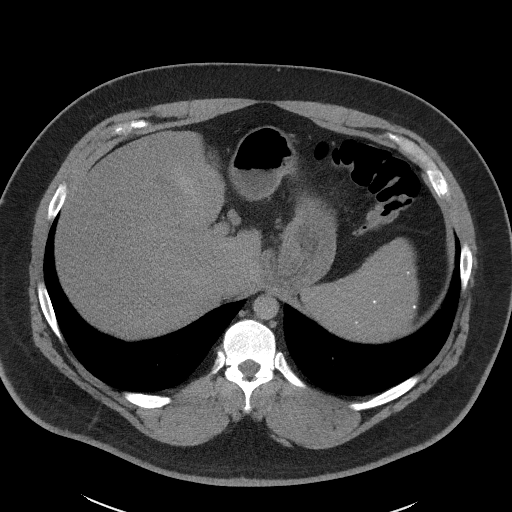
[im 100/116  soft-tissue]
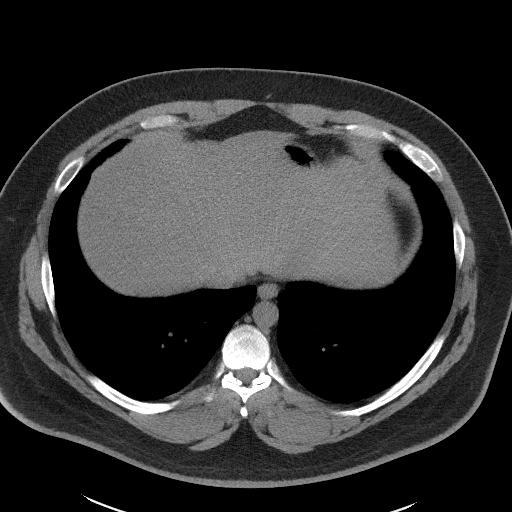
[im 108/116  soft-tissue]
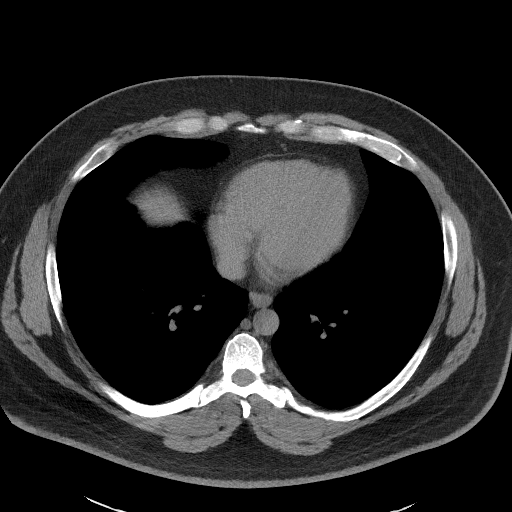

[Series 5: coronal st · coronal · 0.82mm/px · 3 of 101 slices shown]
[im 34/101  soft-tissue]
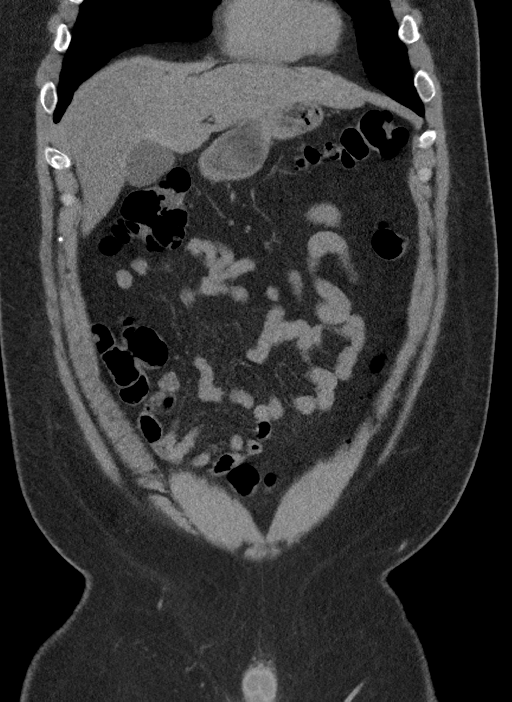
[im 45/101  soft-tissue]
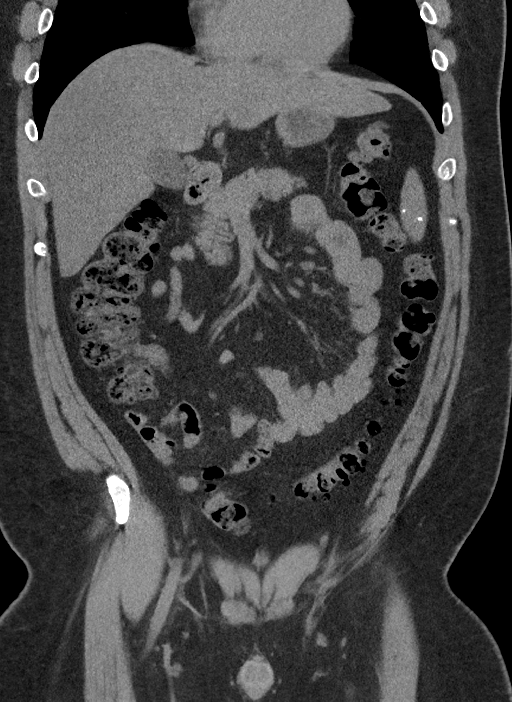
[im 56/101  soft-tissue]
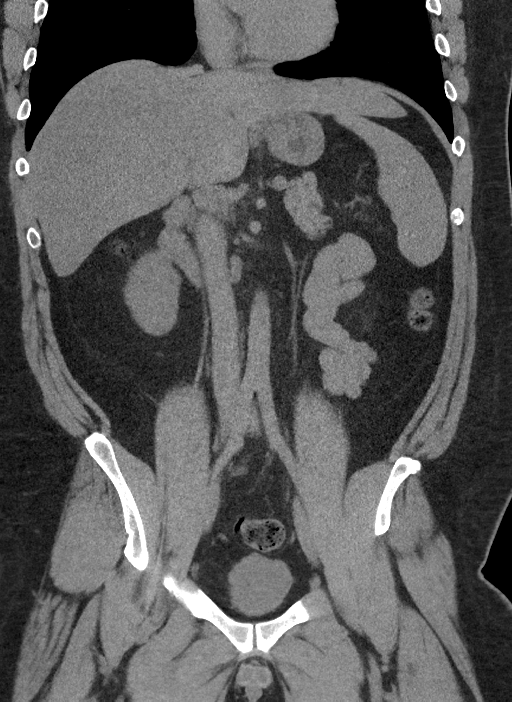

[16 of 46 positions shown; findings below may reference images not displayed]

FINDINGS: Lower chest:  No contributory findings.

Hepatobiliary: Hepatic steatosis with pericholecystic sparing.No
evidence of biliary obstruction or stone.

Pancreas: Unremarkable.

Spleen: Diffuse granulomatous calcification.

Adrenals/Urinary Tract: Negative adrenals. No hydronephrosis or
ureteral stone. Punctate left renal calculus. Unremarkable bladder.

Stomach/Bowel:  No obstruction. No appendicitis.

Vascular/Lymphatic: No acute vascular abnormality. No mass or
adenopathy.

Reproductive:No pathologic findings.

Other: No ascites or pneumoperitoneum.

Musculoskeletal: No acute abnormalities.
IMPRESSION: 1. No acute finding.  No hydronephrosis or ureteral calculus.
2. Punctate left renal calculus.
3. Hepatic steatosis.

## 2022-08-07 ENCOUNTER — Encounter: Payer: Self-pay | Admitting: Emergency Medicine

## 2022-08-07 ENCOUNTER — Other Ambulatory Visit: Payer: Self-pay

## 2022-08-07 ENCOUNTER — Ambulatory Visit
Admission: EM | Admit: 2022-08-07 | Discharge: 2022-08-07 | Disposition: A | Discharge status: Home / Self Care | Payer: Medicaid Other | Attending: Family Medicine | Admitting: Family Medicine

## 2022-08-07 DIAGNOSIS — Z20822 Contact with and (suspected) exposure to covid-19: Secondary | ICD-10-CM | POA: Insufficient documentation

## 2022-08-07 DIAGNOSIS — J039 Acute tonsillitis, unspecified: Secondary | ICD-10-CM | POA: Insufficient documentation

## 2022-08-07 LAB — RESP PANEL BY RT-PCR (FLU A&B, COVID) ARPGX2
Influenza A by PCR: NEGATIVE
Influenza B by PCR: NEGATIVE
SARS Coronavirus 2 by RT PCR: NEGATIVE

## 2022-08-07 LAB — POCT RAPID STREP A (OFFICE): Rapid Strep A Screen: NEGATIVE

## 2022-08-07 MED ORDER — AMOXICILLIN 875 MG PO TABS: 875.0000 mg | ORAL_TABLET | Freq: Two times a day (BID) | ORAL | 0 refills | Status: DC

## 2022-08-07 NOTE — ED Triage Notes (Signed)
Pt reports sore throat and left sided facial pain for last several days. Pt denies any known fevers.

## 2022-08-07 NOTE — ED Provider Notes (Signed)
[facility]    CSN: [id number] Arrival date & time: 08/07/22  [id number]      History   Chief Complaint Chief Complaint  Patient presents with   Sore Throat    HPI Derrick Barnes is a 36 y.o. male.   Presenting today with several day history of severe sore, swollen throat, difficulty swallowing, left-sided facial pain.  Denies known fever, chills, body aches, cough, congestion, chest pain, shortness of breath.  Not tried anything over-the-counter for symptoms thus far.  No known sick contacts recently.    Past Medical History:  Diagnosis Date   Bipolar disorder ([place])    Diabetes mellitus without complication ([place])    GERD (gastroesophageal reflux disease)    History of COVID-19 11/21/2020   Hypertension    Sleep apnea     Patient Active Problem List   Diagnosis Date Noted   Black stool 05/05/2022   Dysphagia 05/05/2022   Heartburn 05/05/2022   Pain of upper abdomen 05/05/2022   Loose stools 05/05/2022   Low back pain 05/07/2021   Unilateral primary osteoarthritis, right knee     Past Surgical History:  Procedure Laterality Date   ABDOMINAL SURGERY     HERNIA REPAIR     2018 or 2019 in [place] Right 01/22/2021   Procedure: RIGHT KNEE ARTHROSCOPY AND DEBRIDEMENT;  Surgeon: Newt Minion, MD;  Location: [facility];  Service: Orthopedics;  Laterality: Right;       Home Medications    Prior to Admission medications   Medication Sig Start Date End Date Taking? Authorizing Provider  amoxicillin (AMOXIL) 875 MG tablet Take 1 tablet (875 mg total) by mouth 2 (two) times daily. 08/07/22  Yes Volney American, PA-C  Semaglutide (OZEMPIC, 1 MG/DOSE, [place]) Inject into the skin once a week.   Yes [provider]  albuterol (VENTOLIN HFA) 108 (90 Base) MCG/ACT inhaler Inhale 2 puffs into the lungs every 6 (six) hours as needed for wheezing or shortness of breath. 05/07/22   Leath-Warren, Alda Lea, NP   brompheniramine-pseudoephedrine-DM 30-2-10 MG/5ML syrup Take 5 mLs by mouth 4 (four) times daily as needed. 05/07/22   Leath-Warren, Alda Lea, NP  diphenhydrAMINE (BENADRYL) 25 MG tablet Take 1 tablet (25 mg total) by mouth every 6 (six) hours. 06/27/22   Maudie Flakes, MD  hydrocortisone cream 1 % Apply to affected area 2 times daily 06/27/22   Maudie Flakes, MD  ibuprofen (ADVIL) 800 MG tablet Take 800 mg by mouth every 8 (eight) hours as needed.    [provider]  lidocaine (XYLOCAINE) 2 % solution Use as directed 10 mLs in the mouth or throat every 6 (six) hours as needed for mouth pain. 05/07/22   Leath-Warren, Alda Lea, NP  losartan (COZAAR) 50 MG tablet Take 50 mg by mouth daily. 11/25/21   [provider]  metFORMIN (GLUCOPHAGE) 500 MG tablet Take by mouth 2 (two) times daily with a meal.    [provider]  pantoprazole (PROTONIX) 40 MG tablet Take 1 tablet (40 mg total) by mouth 2 (two) times daily before a meal. 05/05/22   Erenest Rasher, PA-C    Family History Family History  Problem Relation Age of Onset   Diabetes Father    Cancer Father    Heart disease Father    Colon cancer Paternal Uncle        7   Inflammatory bowel disease Neg Hx    Celiac disease  Neg Hx     Social History Social History   Tobacco Use   Smoking status: Every Day    Packs/day: 0.25    Types: Cigarettes   Smokeless tobacco: Never  Vaping Use   Vaping Use: Never used  Substance Use Topics   Alcohol use: Not Currently    Comment: occ   Drug use: Not Currently     Allergies   Alfalfa and Trulicity [dulaglutide]   Review of Systems Review of Systems Per HPI  Physical Exam Triage Vital Signs ED Triage Vitals  Enc Vitals Group     BP 08/07/22 1123 (!) 135/93     Pulse Rate 08/07/22 1123 91     Resp 08/07/22 1123 20     Temp 08/07/22 1123 98 F (36.7 C)     Temp Source 08/07/22 1123 Oral     SpO2 08/07/22 1123 98 %     Weight --      Height --       Head Circumference --      Peak Flow --      Pain Score 08/07/22 1124 8     Pain Loc --      Pain Edu? --      Excl. in [place]? --    No data found.  Updated Vital Signs BP (!) 135/93 (BP Location: Right Arm)   Pulse 91   Temp 98 F (36.7 C) (Oral)   Resp 20   SpO2 98%   Visual Acuity Right Eye Distance:   Left Eye Distance:   Bilateral Distance:    Right Eye Near:   Left Eye Near:    Bilateral Near:     Physical Exam Vitals and nursing note reviewed.  Constitutional:      Appearance: Normal appearance.  HENT:     Head: Atraumatic.     Right Ear: Tympanic membrane normal.     Left Ear: Tympanic membrane normal.     Nose: Nose normal.     Mouth/Throat:     Mouth: Mucous membranes are moist.     Pharynx: Posterior oropharyngeal erythema present. No oropharyngeal exudate.     Comments: Moderate tonsillar edema, erythema bilaterally.  No exudates, uvula midline, oral airway patent Eyes:     Extraocular Movements: Extraocular movements intact.     Conjunctiva/sclera: Conjunctivae normal.  Cardiovascular:     Rate and Rhythm: Normal rate and regular rhythm.     Heart sounds: Normal heart sounds.  Pulmonary:     Effort: Pulmonary effort is normal.     Breath sounds: Normal breath sounds.  Musculoskeletal:        General: Normal range of motion.     Cervical back: Normal range of motion and neck supple.  Lymphadenopathy:     Cervical: Cervical adenopathy present.  Skin:    General: Skin is warm and dry.  Neurological:     General: No focal deficit present.     Mental Status: He is oriented to person, place, and time.     Motor: No weakness.     Gait: Gait normal.  Psychiatric:        Mood and Affect: Mood normal.        Thought Content: Thought content normal.        Judgment: Judgment normal.      UC Treatments / Results  Labs (all labs ordered are listed, but only abnormal results are displayed) Labs Reviewed  CULTURE, GROUP A STREP [facility])  RESP  PANEL BY RT-PCR (FLU A&B, COVID) [id number]  POCT RAPID STREP A (OFFICE)    EKG   Radiology No results found.  Procedures Procedures (including critical care time)  Medications Ordered in UC Medications - No data to display  Initial Impression / Assessment and Plan / UC Course  I have reviewed the triage vital signs and the nursing notes.  Pertinent labs & imaging results that were available during my care of the patient were reviewed by me and considered in my medical decision making (see chart for details).     Vital signs reassuring today, rapid strep negative, throat culture and respiratory panel pending.  Given the nature of her symptoms and exam findings will cover with amoxicillin while awaiting these results in case bacterial tonsillitis.  Discussed supportive over-the-counter medications and home care additionally.  Return for any worsening symptoms.  Final Clinical Impressions(s) / UC Diagnoses   Final diagnoses:  Acute tonsillitis, unspecified etiology   Discharge Instructions   None    ED Prescriptions     Medication Sig Dispense Auth. Provider   amoxicillin (AMOXIL) 875 MG tablet Take 1 tablet (875 mg total) by mouth 2 (two) times daily. 20 tablet Volney American, [place]      PDMP not reviewed this encounter.   Volney American, [place] 08/07/22 1153

## 2022-08-10 LAB — CULTURE, GROUP A STREP (THRC)

## 2022-08-14 ENCOUNTER — Encounter (HOSPITAL_BASED_OUTPATIENT_CLINIC_OR_DEPARTMENT_OTHER): Payer: Self-pay

## 2022-08-14 DIAGNOSIS — R0683 Snoring: Secondary | ICD-10-CM

## 2022-08-14 DIAGNOSIS — R5383 Other fatigue: Secondary | ICD-10-CM

## 2022-08-14 DIAGNOSIS — G4710 Hypersomnia, unspecified: Secondary | ICD-10-CM

## 2022-08-20 ENCOUNTER — Telehealth: Payer: Self-pay | Admitting: *Deleted

## 2022-08-20 NOTE — Telephone Encounter (Signed)
Received VM from pt. He needs an OV to be re-evaluated for his symptoms not seen since June. Thanks!

## 2022-08-24 NOTE — Progress Notes (Deleted)
Referring Provider: Alanson Puls [facility] Primary Care Physician:  [facility] Primary GI Physician: Dr. Abbey Chatters  No chief complaint on file.   HPI:   Derrick Barnes is a 36 y.o. 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  He reported chronic heartburn that had previously resolved following laparoscopic HH/antireflux surgery about 4 years ago in [place], but symptoms returned about 7 months ago currently on Protonix 40 mg daily with daily breakthrough symptoms.  Reported previously tried essentially all PPIs prior to antireflux surgery.  Also reported daily dysphagia symptoms with intermittent regurgitation, greater than 1 year history of intermittent abdominal pain just above umbilicus radiating to right upper quadrant without specific triggers lasting 1-2 weeks at a time, then resolving for a week or so.  Associated intermittent nausea as well as increased bowel frequency initially then tapers off in a couple of days.  Otherwise, chronic history of postprandial bowel movements for the last 10 years eating 1 meal a day to reduce bowel frequency but reported he would like about 6 bowel movements of eating 3 meals per day.  Nocturnal stools about 4 nights a week.  Intermittent black stools for the last 3 years.  Taking ibuprofen chronically for knee pain.  Prior evaluation in the emergency room for similar symptoms in January.  CT without acute abnormalities.  No significant laboratory abnormalities.  On exam, he had mild TTP in RUQ, epigastric region, RLQ without rebound or guarding.  Planned to update labs, screen for thyroid abnormalities and celiac disease, check inflammatory markers, stool studies, EGD, increase Protonix to twice daily.  Unfortunately, we  are not able to reach patient after his office visit to set up his EGD despite multiple attempts.  Labs I ordered were not completed.     Today:     Past Medical History:  Diagnosis Date   Bipolar disorder ([place])    Diabetes mellitus without complication ([place])    GERD (gastroesophageal reflux disease)    History of COVID-19 11/21/2020   Hypertension    Sleep apnea     Past Surgical History:  Procedure Laterality Date   ABDOMINAL SURGERY     HERNIA REPAIR     2018 or 2019 in [place] Right 01/22/2021   Procedure: RIGHT KNEE ARTHROSCOPY AND DEBRIDEMENT;  Surgeon: Newt Minion, MD;  Location: [place] [facility];  Service: Orthopedics;  Laterality: Right;    Current Outpatient Medications  Medication Sig Dispense Refill   albuterol (VENTOLIN HFA) 108 (90 Base) MCG/ACT inhaler Inhale 2 puffs into the lungs every 6 (six) hours as needed for wheezing or shortness of breath. 8 g 0   amoxicillin (AMOXIL) 875 MG tablet Take 1 tablet (875 mg total) by mouth 2 (two) times daily. 20 tablet 0   brompheniramine-pseudoephedrine-DM 30-2-10 MG/5ML syrup Take 5 mLs by mouth 4 (four) times daily as needed. 140 mL 0   diphenhydrAMINE (BENADRYL) 25 MG tablet Take 1 tablet (25 mg total) by mouth every 6 (six) hours. 20 tablet 0   hydrocortisone cream 1 % Apply to affected area 2 times daily 15 g 0   ibuprofen (ADVIL) 800 MG tablet Take 800 mg by mouth every 8 (eight) hours as needed.     lidocaine (XYLOCAINE) 2 % solution  Use as directed 10 mLs in the mouth or throat every 6 (six) hours as needed for mouth pain. 100 mL 0   losartan (COZAAR) 50 MG tablet Take 50 mg by mouth daily.     metFORMIN (GLUCOPHAGE) 500 MG tablet Take by mouth 2 (two) times daily with a meal.     pantoprazole (PROTONIX) 40 MG tablet Take 1 tablet (40 mg total) by mouth 2 (two) times daily before a meal. 60 tablet 5   Semaglutide (OZEMPIC, 1 MG/DOSE, [place]) Inject into the skin once a week.     No  current facility-administered medications for this visit.    Allergies as of 08/25/2022 - Review Complete 08/07/2022  Allergen Reaction Noted   Alfalfa Cough 07/05/2020   Trulicity [dulaglutide] Rash 04/15/2020    Family History  Problem Relation Age of Onset   Diabetes Father    Cancer Father    Heart disease Father    Colon cancer Paternal Uncle        34   Inflammatory bowel disease Neg Hx    Celiac disease Neg Hx     Social History   Socioeconomic History   Marital status: Single    Spouse name: Not on file   Number of children: Not on file   Years of education: Not on file   Highest education level: Not on file  Occupational History   Not on file  Tobacco Use   Smoking status: Every Day    Packs/day: 0.25    Types: Cigarettes   Smokeless tobacco: Never  Vaping Use   Vaping Use: Never used  Substance and Sexual Activity   Alcohol use: Not Currently    Comment: occ   Drug use: Not Currently   Sexual activity: Not on file  Other Topics Concern   Not on file  Social History Narrative   ** Merged History Encounter **       Social Determinants of Health   Financial Resource Strain: Not on file  Food Insecurity: Not on file  Transportation Needs: Not on file  Physical Activity: Not on file  Stress: Not on file  Social Connections: Not on file    Review of Systems: Gen: Denies fever, chills, cold or flu like symptoms, pre-syncope, or syncope.   CV: Denies chest pain, palpitations. Resp: Denies dyspnea, cough.  GI: See HPI Heme: See HPI  Physical Exam: There were no vitals taken for this visit. General:   Alert and oriented. No distress noted. Pleasant and cooperative.  Head:  Normocephalic and atraumatic. Eyes:  Conjuctiva clear without scleral icterus. Heart:  S1, S2 present without murmurs appreciated. Lungs:  Clear to auscultation bilaterally. No wheezes, rales, or rhonchi. No distress.  Abdomen:  +BS, soft, non-tender and non-distended. No  rebound or guarding. No HSM or masses noted. Msk:  Symmetrical without gross deformities. Normal posture. Extremities:  Without edema. Neurologic:  Alert and  oriented x4 Psych:  Normal mood and affect.    Assessment:     Plan:  ***   Ermalinda Memos, PA-C [facility] 08/25/2022

## 2022-08-25 ENCOUNTER — Ambulatory Visit: Payer: Medicaid Other | Admitting: Gastroenterology

## 2022-09-29 ENCOUNTER — Encounter: Payer: Medicaid Other | Admitting: Neurology

## 2022-10-03 ENCOUNTER — Encounter: Payer: Medicaid Other | Admitting: Neurology

## 2022-11-02 ENCOUNTER — Other Ambulatory Visit: Payer: Self-pay | Admitting: Gastroenterology

## 2022-11-02 DIAGNOSIS — R12 Heartburn: Secondary | ICD-10-CM

## 2022-12-13 ENCOUNTER — Encounter (HOSPITAL_COMMUNITY): Payer: Self-pay | Admitting: *Deleted

## 2022-12-13 ENCOUNTER — Emergency Department (HOSPITAL_COMMUNITY)
Admission: EM | Admit: 2022-12-13 | Discharge: 2022-12-13 | Disposition: A | Discharge status: Home / Self Care | Payer: Medicaid Other | Attending: Student | Admitting: Student

## 2022-12-13 ENCOUNTER — Emergency Department (HOSPITAL_COMMUNITY): Payer: Medicaid Other

## 2022-12-13 ENCOUNTER — Other Ambulatory Visit: Payer: Self-pay

## 2022-12-13 DIAGNOSIS — Z794 Long term (current) use of insulin: Secondary | ICD-10-CM | POA: Diagnosis not present

## 2022-12-13 DIAGNOSIS — R509 Fever, unspecified: Secondary | ICD-10-CM | POA: Insufficient documentation

## 2022-12-13 DIAGNOSIS — Z20822 Contact with and (suspected) exposure to covid-19: Secondary | ICD-10-CM | POA: Insufficient documentation

## 2022-12-13 DIAGNOSIS — J029 Acute pharyngitis, unspecified: Secondary | ICD-10-CM | POA: Diagnosis present

## 2022-12-13 DIAGNOSIS — M791 Myalgia, unspecified site: Secondary | ICD-10-CM | POA: Insufficient documentation

## 2022-12-13 DIAGNOSIS — R059 Cough, unspecified: Secondary | ICD-10-CM | POA: Insufficient documentation

## 2022-12-13 LAB — GROUP A STREP BY PCR: Group A Strep by PCR: NOT DETECTED

## 2022-12-13 LAB — RESP PANEL BY RT-PCR (RSV, FLU A&B, COVID)  RVPGX2
Influenza A by PCR: NEGATIVE
Influenza B by PCR: NEGATIVE
Resp Syncytial Virus by PCR: NEGATIVE
SARS Coronavirus 2 by RT PCR: NEGATIVE

## 2022-12-13 MED ORDER — ALBUTEROL SULFATE HFA 108 (90 BASE) MCG/ACT IN AERS
2.0000 | INHALATION_SPRAY | Freq: Once | RESPIRATORY_TRACT | Status: AC
  Administered 2022-12-13: 2 via RESPIRATORY_TRACT
  Filled 2022-12-13: qty 6.7

## 2022-12-13 MED ORDER — DEXAMETHASONE SODIUM PHOSPHATE 10 MG/ML IJ SOLN
10.0000 mg | Freq: Once | INTRAMUSCULAR | Status: AC
  Administered 2022-12-13: 10 mg via INTRAMUSCULAR
  Filled 2022-12-13: qty 1

## 2022-12-13 MED ORDER — PREDNISONE 20 MG PO TABS: 40.0000 mg | ORAL_TABLET | Freq: Every day | ORAL | 0 refills | Status: AC

## 2022-12-13 NOTE — ED Triage Notes (Signed)
Pt with sore throat x 5 days, fever few days ago. + body aches and productive cough.

## 2022-12-13 NOTE — ED Provider Notes (Cosign Needed Addendum)
[facility] Provider Note   CSN: [id number] Arrival date & time: 12/13/22  1707     History  Chief Complaint  Patient presents with   Sore Throat    Derrick Barnes is a 37 y.o. male who presents to the ED with concerns for sore throat x 5 days.  Has associated fever, generalized bodyaches, productive cough.  No meds tried at home.  Denies trouble swallowing, trouble breathing.  No sick contacts at home.  The history is provided by the patient. No language interpreter was used.       Home Medications Prior to Admission medications   Medication Sig Start Date End Date Taking? Authorizing Provider  predniSONE (DELTASONE) 20 MG tablet Take 2 tablets (40 mg total) by mouth daily for 5 days. 12/13/22 12/18/22 Yes Aloma Boch A, PA-C  albuterol (VENTOLIN HFA) 108 (90 Base) MCG/ACT inhaler Inhale 2 puffs into the lungs every 6 (six) hours as needed for wheezing or shortness of breath. 05/07/22   Leath-Warren, Alda Lea, NP  amoxicillin (AMOXIL) 875 MG tablet Take 1 tablet (875 mg total) by mouth 2 (two) times daily. 08/07/22   Volney American, PA-C  brompheniramine-pseudoephedrine-DM 30-2-10 MG/5ML syrup Take 5 mLs by mouth 4 (four) times daily as needed. 05/07/22   Leath-Warren, Alda Lea, NP  diphenhydrAMINE (BENADRYL) 25 MG tablet Take 1 tablet (25 mg total) by mouth every 6 (six) hours. 06/27/22   Maudie Flakes, MD  hydrocortisone cream 1 % Apply to affected area 2 times daily 06/27/22   Maudie Flakes, MD  ibuprofen (ADVIL) 800 MG tablet Take 800 mg by mouth every 8 (eight) hours as needed.    [provider]  lidocaine (XYLOCAINE) 2 % solution Use as directed 10 mLs in the mouth or throat every 6 (six) hours as needed for mouth pain. 05/07/22   Leath-Warren, Alda Lea, NP  losartan (COZAAR) 50 MG tablet Take 50 mg by mouth daily. 11/25/21   [provider]  metFORMIN (GLUCOPHAGE) 500 MG tablet Take by mouth 2 (two) times  daily with a meal.    [provider]  pantoprazole (PROTONIX) 40 MG tablet TAKE 1 TABLET(40 MG) BY MOUTH TWICE DAILY BEFORE A MEAL 11/05/22   Carlan, Chelsea L, NP  Semaglutide (OZEMPIC, 1 MG/DOSE, [place]) Inject into the skin once a week.    [provider]      Allergies    Alfalfa and Trulicity [dulaglutide]    Review of Systems   Review of Systems  All other systems reviewed and are negative.   Physical Exam Updated Vital Signs BP (!) 165/105 (BP Location: Right Arm)   Pulse 91   Temp 98.1 F (36.7 C) (Oral)   Resp 18   Ht 6\' 3"  (1.905 m)   Wt (!) 160.1 kg   SpO2 99%   BMI 44.12 kg/m  Physical Exam Vitals and nursing note reviewed.  Constitutional:      General: He is not in acute distress.    Appearance: Normal appearance.  Eyes:     General: No scleral icterus.    Extraocular Movements: Extraocular movements intact.  Cardiovascular:     Rate and Rhythm: Normal rate.  Pulmonary:     Effort: Pulmonary effort is normal. No respiratory distress.     Comments: Wheezing noted to left lower lung field Abdominal:     Palpations: Abdomen is soft. There is no mass.     Tenderness: There is no  abdominal tenderness.  Musculoskeletal:        General: Normal range of motion.     Cervical back: Neck supple.  Skin:    General: Skin is warm and dry.     Findings: No rash.  Neurological:     Mental Status: He is alert.     Sensory: Sensation is intact.     Motor: Motor function is intact.  Psychiatric:        Behavior: Behavior normal.     ED Results / Procedures / Treatments   Labs (all labs ordered are listed, but only abnormal results are displayed) Labs Reviewed  RESP PANEL BY RT-PCR (RSV, FLU A&B, COVID)  RVPGX2  GROUP A STREP BY PCR    EKG None  Radiology DG Chest 2 View  Result Date: 12/13/2022 CLINICAL DATA:  Sore throat for 5 days, fever, body aches, productive cough EXAM: CHEST - 2 VIEW COMPARISON:  None Available. FINDINGS: Frontal  and lateral views of the chest are obtained. The cardiac silhouette is unremarkable. No airspace disease, effusion, or pneumothorax. No acute bony abnormalities. IMPRESSION: 1. No acute intrathoracic process. Electronically Signed   By: Randa Ngo M.D.   On: 12/13/2022 17:59    Procedures Procedures    Medications Ordered in ED Medications  dexamethasone (DECADRON) injection 10 mg (10 mg Intramuscular Given 12/13/22 1814)  albuterol (VENTOLIN HFA) 108 (90 Base) MCG/ACT inhaler 2 puff (2 puffs Inhalation Given 12/13/22 1804)    ED Course/ Medical Decision Making/ A&P Clinical Course as of 12/13/22 1831  Sat Dec 13, 2022  1809 Reevaluated and patient with improvement of symptoms with treatment regimen in the ED.  Lungs clear to auscultation bilaterally.  Discussed with patient lab and imaging findings.  Discussed with patient discharge treatment plan.  Answered all vital questions.  Patient present for discharge at this time. [SB]    Clinical Course User Index [SB] Zulema Pulaski A, PA-C                             Medical Decision Making Amount and/or Complexity of Data Reviewed Radiology: ordered.  Risk Prescription drug management.   Pt presents with sore throat onset 5 days. Vital signs, patient afebrile, not tachycardic or hypoxic. On exam, pt with wheezing noted to left lower lung field.  Able to speak in clear complete sentences.. No acute cardiovascular, respiratory, abdominal exam findings. Differential diagnosis includes COVID, flu, RSV, strep pharyngitis, viral pharyngitis, viral URI with cough, PNA.    Labs:  I ordered, and personally interpreted labs.  The pertinent results include:   Negative COVID, flu, RSV, strep  Imaging: I ordered imaging studies including Chest x-ray I independently visualized and interpreted imaging which showed: No acute findings I agree with the radiologist interpretation  Medications:  I ordered medication including Decadron, albuterol  inhaler for symptom management Reevaluation of the patient after these medicines and interventions, I reevaluated the patient and found that they have improved I have reviewed the patients home medicines and have made adjustments as needed   Disposition: Presentation likely suspicious for viral pharyngitis and viral URI with cough.  Doubt concerns at this time for COVID, flu, RSV, strep pharyngitis.  No concerns at this time for pneumonia.  After consideration of the diagnostic results and the patients response to treatment, I feel that the patient would benefit from Discharge home.  Prescription for prednisone sent to patient's pharmacy.  Supportive care measures and  strict return precautions discussed with patient at bedside. Pt acknowledges and verbalizes understanding. Pt appears safe for discharge. Follow up as indicated in discharge paperwork.    This chart was dictated using voice recognition software, Dragon. Despite the best efforts of this provider to proofread and correct errors, errors may still occur which can change documentation meaning.   Final Clinical Impression(s) / ED Diagnoses Final diagnoses:  Viral pharyngitis    Rx / DC Orders ED Discharge Orders          Ordered    predniSONE (DELTASONE) 20 MG tablet  Daily        12/13/22 [address], Nissi Doffing A, PA-C 12/13/22 1830    Elyan Vanwieren A, PA-C 12/13/22 1831    Teressa Lower, MD 12/14/22 1557

## 2022-12-13 NOTE — Discharge Instructions (Addendum)
It was a pleasure taking care of you today!  Your COVID, flu, RSV, strep swabs were negative today.  Chest x-ray did not show any concerning findings for pneumonia.  You may continue with over-the-counter cough and cold medications for your symptoms.  You may try over-the-counter Chloraseptic spray or Vicks Vapocool throat spray for your symptoms.  You may use your inhaler up to 2 puffs every 4 hours as needed for your symptoms.  Also be sent a prescription for prednisone, take as directed.  Follow-up with your primary care provider regarding today's ED visit.  Return to the emergency department if you are experiencing increasing/worsening symptoms.

## 2023-01-07 ENCOUNTER — Emergency Department (HOSPITAL_COMMUNITY)
Admission: EM | Admit: 2023-01-07 | Discharge: 2023-01-07 | Disposition: A | Discharge status: Home / Self Care | Payer: Medicaid Other | Attending: Emergency Medicine | Admitting: Emergency Medicine

## 2023-01-07 ENCOUNTER — Other Ambulatory Visit: Payer: Self-pay

## 2023-01-07 DIAGNOSIS — M5416 Radiculopathy, lumbar region: Secondary | ICD-10-CM | POA: Diagnosis not present

## 2023-01-07 DIAGNOSIS — R10819 Abdominal tenderness, unspecified site: Secondary | ICD-10-CM

## 2023-01-07 DIAGNOSIS — R1031 Right lower quadrant pain: Secondary | ICD-10-CM | POA: Diagnosis present

## 2023-01-07 DIAGNOSIS — R1011 Right upper quadrant pain: Secondary | ICD-10-CM | POA: Diagnosis not present

## 2023-01-07 LAB — CBC WITH DIFFERENTIAL/PLATELET
Abs Immature Granulocytes: 0.04 10*3/uL (ref 0.00–0.07)
Basophils Absolute: 0.1 10*3/uL (ref 0.0–0.1)
Basophils Relative: 2 %
Eosinophils Absolute: 0.3 10*3/uL (ref 0.0–0.5)
Eosinophils Relative: 6 %
HCT: 37.7 % — ABNORMAL LOW (ref 39.0–52.0)
Hemoglobin: 13.2 g/dL (ref 13.0–17.0)
Immature Granulocytes: 1 %
Lymphocytes Relative: 36 %
Lymphs Abs: 2 10*3/uL (ref 0.7–4.0)
MCH: 30.4 pg (ref 26.0–34.0)
MCHC: 35 g/dL (ref 30.0–36.0)
MCV: 86.9 fL (ref 80.0–100.0)
Monocytes Absolute: 0.6 10*3/uL (ref 0.1–1.0)
Monocytes Relative: 12 %
Neutro Abs: 2.5 10*3/uL (ref 1.7–7.7)
Neutrophils Relative %: 43 %
Platelets: 192 10*3/uL (ref 150–400)
RBC: 4.34 MIL/uL (ref 4.22–5.81)
RDW: 12.8 % (ref 11.5–15.5)
WBC: 5.6 10*3/uL (ref 4.0–10.5)
nRBC: 0 % (ref 0.0–0.2)

## 2023-01-07 LAB — COMPREHENSIVE METABOLIC PANEL
ALT: 26 U/L (ref 0–44)
AST: 24 U/L (ref 15–41)
Albumin: 3.8 g/dL (ref 3.5–5.0)
Alkaline Phosphatase: 79 U/L (ref 38–126)
Anion gap: 8 (ref 5–15)
BUN: 11 mg/dL (ref 6–20)
CO2: 24 mmol/L (ref 22–32)
Calcium: 8.9 mg/dL (ref 8.9–10.3)
Chloride: 106 mmol/L (ref 98–111)
Creatinine, Ser: 0.93 mg/dL (ref 0.61–1.24)
GFR, Estimated: >60 mL/min (ref >60)
Glucose, Bld: 182 mg/dL — ABNORMAL HIGH (ref 70–99)
Potassium: 3.5 mmol/L (ref 3.5–5.1)
Sodium: 138 mmol/L (ref 135–145)
Total Bilirubin: 0.4 mg/dL (ref 0.3–1.2)
Total Protein: 6.6 g/dL (ref 6.5–8.1)

## 2023-01-07 LAB — LIPASE, BLOOD: Lipase: 52 U/L — ABNORMAL HIGH (ref 11–51)

## 2023-01-07 NOTE — ED Triage Notes (Signed)
Pt states he was seen by his PCP today for his yearly exam and was told to come here to be evaluated for tenderness with palpation to his gallbladder and appendix, as well has numbness and cramps to his left leg that started on Saturday.

## 2023-01-07 NOTE — ED Provider Notes (Signed)
Derrick Barnes  Provider Note  CSN: [id number] Arrival date & time: 01/07/23 0037  History No chief complaint on file.   Derrick Barnes is a 37 y.o. male reports he was at his PCP office today for regularly scheduled annual exam and during their evaluation was noted to have tenderness on palpation of his RLQ and RUQ. He denies any abdominal pain at rest, but does report some discomfort when riding in the car/hitting bumps in the road. He has not had any N/V/D or dysuria. He also reported to them he had been having some cramping pain in his L hamstring and some numbness to anterior L lower leg. The PA at his PCP office reportedly heard a murmur and was worried he 'had a stroke or was about to have a stroke'. He was advised then to come to the ED but he had to wait for his SO to go to work. He has a history of low back problems and thought his leg symptoms were related to that.    Home Medications Prior to Admission medications   Medication Sig Start Date End Date Taking? Authorizing Provider  albuterol (VENTOLIN HFA) 108 (90 Base) MCG/ACT inhaler Inhale 2 puffs into the lungs every 6 (six) hours as needed for wheezing or shortness of breath. 05/07/22   Leath-Warren, Alda Lea, NP  amoxicillin (AMOXIL) 875 MG tablet Take 1 tablet (875 mg total) by mouth 2 (two) times daily. 08/07/22   Volney American, PA-C  brompheniramine-pseudoephedrine-DM 30-2-10 MG/5ML syrup Take 5 mLs by mouth 4 (four) times daily as needed. 05/07/22   Leath-Warren, Alda Lea, NP  diphenhydrAMINE (BENADRYL) 25 MG tablet Take 1 tablet (25 mg total) by mouth every 6 (six) hours. 06/27/22   Maudie Flakes, MD  hydrocortisone cream 1 % Apply to affected area 2 times daily 06/27/22   Maudie Flakes, MD  ibuprofen (ADVIL) 800 MG tablet Take 800 mg by mouth every 8 (eight) hours as needed.    [provider]  lidocaine (XYLOCAINE) 2 % solution Use as directed 10 mLs in the  mouth or throat every 6 (six) hours as needed for mouth pain. 05/07/22   Leath-Warren, Alda Lea, NP  losartan (COZAAR) 50 MG tablet Take 50 mg by mouth daily. 11/25/21   [provider]  metFORMIN (GLUCOPHAGE) 500 MG tablet Take by mouth 2 (two) times daily with a meal.    [provider]  pantoprazole (PROTONIX) 40 MG tablet TAKE 1 TABLET(40 MG) BY MOUTH TWICE DAILY BEFORE A MEAL 11/05/22   Carlan, Chelsea L, NP  Semaglutide (OZEMPIC, 1 MG/DOSE, [place]) Inject into the skin once a week.    [provider]     Allergies    Alfalfa and Trulicity [dulaglutide]   Review of Systems   Review of Systems Please see HPI for pertinent positives and negatives  Physical Exam BP (!) 140/78   Pulse 60   Temp 97.8 F (36.6 C) (Oral)   Resp 17   Ht '6\' 4"'$  (1.93 m)   Wt (!) 165.6 kg   SpO2 99%   BMI 44.43 kg/m   Physical Exam Vitals and nursing note reviewed.  Constitutional:      Appearance: Normal appearance.  HENT:     Head: Normocephalic and atraumatic.     Nose: Nose normal.     Mouth/Throat:     Mouth: Mucous membranes are moist.  Eyes:     Extraocular Movements: Extraocular movements  intact.     Conjunctiva/sclera: Conjunctivae normal.  Cardiovascular:     Rate and Rhythm: Normal rate.     Heart sounds: No murmur heard. Pulmonary:     Effort: Pulmonary effort is normal.     Breath sounds: Normal breath sounds.  Abdominal:     General: Abdomen is flat.     Palpations: Abdomen is soft.     Tenderness: There is abdominal tenderness (RUQ and RLQ). There is no guarding. Negative signs include Murphy's sign and McBurney's sign.  Musculoskeletal:        General: No swelling. Normal range of motion.     Cervical back: Neck supple.  Skin:    General: Skin is warm and dry.  Neurological:     General: No focal deficit present.     Mental Status: He is alert and oriented to person, place, and time.     Cranial Nerves: No cranial nerve deficit.     Sensory:  No sensory deficit.     Motor: No weakness.     Gait: Gait normal.  Psychiatric:        Mood and Affect: Mood normal.     ED Results / Procedures / Treatments   EKG None  Procedures Procedures  Medications Ordered in the ED Medications - No data to display  Initial Impression and Plan  Patient here at the advice of his PCP for evaluation of abdominal tenderness on exam, although no abdominal pain reported. He also has some symptoms of lumbar radiculopathy in LLE but no focal deficits to suggest a central cause of his leg numbness. He does not have a murmur. Will check labs to evaluate his abdominal tenderness, imaging depending on those results.   ED Course   Clinical Course as of 01/07/23 0230  Wed Jan 07, 2023  0218 CBC is normal.  [CS]  0222 CMP and Lipase unchanged from baseline.  [CS]  0228 Patient resting comfortably. With normal labs and no complaints of abdominal pain, imaging is unlikely to be helpful. Recommend he follow up with his PCP for recheck or RTED if he begins to have any abdominal pain, nausea, vomiting, fever or any other concerns.  [CS]    Clinical Course User Index [CS] Truddie Hidden, MD     MDM Rules/Calculators/A&P Medical Decision Making Problems Addressed: Abdominal tenderness without rebound tenderness, unspecified location: acute illness or injury Lumbar radiculopathy: chronic illness or injury with exacerbation, progression, or side effects of treatment  Amount and/or Complexity of Data Reviewed Labs: ordered. Decision-making details documented in ED Course.     Final Clinical Impression(s) / ED Diagnoses Final diagnoses:  Abdominal tenderness without rebound tenderness, unspecified location  Lumbar radiculopathy    Rx / DC Orders ED Discharge Orders     None        Truddie Hidden, MD 01/07/23 0230

## 2023-01-08 ENCOUNTER — Encounter: Payer: Self-pay | Admitting: Radiology

## 2023-02-04 ENCOUNTER — Ambulatory Visit
Admission: EM | Admit: 2023-02-04 | Discharge: 2023-02-04 | Disposition: A | Discharge status: Home / Self Care | Payer: Medicaid Other | Attending: Family Medicine | Admitting: Family Medicine

## 2023-02-04 DIAGNOSIS — J4541 Moderate persistent asthma with (acute) exacerbation: Secondary | ICD-10-CM | POA: Diagnosis not present

## 2023-02-04 DIAGNOSIS — J069 Acute upper respiratory infection, unspecified: Secondary | ICD-10-CM

## 2023-02-04 DIAGNOSIS — R110 Nausea: Secondary | ICD-10-CM | POA: Diagnosis not present

## 2023-02-04 LAB — POCT INFLUENZA A/B
Influenza A, POC: NEGATIVE
Influenza B, POC: NEGATIVE

## 2023-02-04 MED ORDER — PREDNISONE 20 MG PO TABS: 40.0000 mg | ORAL_TABLET | Freq: Every day | ORAL | 0 refills | Status: DC

## 2023-02-04 MED ORDER — PSEUDOEPH-BROMPHEN-DM 30-2-10 MG/5ML PO SYRP: 5.0000 mL | ORAL_SOLUTION | Freq: Four times a day (QID) | ORAL | 0 refills | Status: DC | PRN

## 2023-02-04 MED ORDER — ONDANSETRON 4 MG PO TBDP: 4.0000 mg | ORAL_TABLET | Freq: Three times a day (TID) | ORAL | 0 refills | Status: DC | PRN

## 2023-02-04 MED ORDER — ALBUTEROL SULFATE HFA 108 (90 BASE) MCG/ACT IN AERS: 2.0000 | INHALATION_SPRAY | RESPIRATORY_TRACT | 0 refills | Status: DC | PRN

## 2023-02-04 NOTE — ED Triage Notes (Signed)
Patient here today with symptoms of cough, nausea, and abdominal pain X 3 days. He has also been having some SOB and wheeze. Has a h/o asthma. Uses Albuterol inhaler. He has had a fever, chills, and sweats since yesterday. No vomiting but did have diarrhea yesterday. Patient has been taking Benadryl which helps him sleep. No sick contacts. No recent travel.

## 2023-02-04 NOTE — ED Provider Notes (Signed)
[facility]    CSN: [id number] Arrival date & time: 02/04/23  1649      History   Chief Complaint Chief Complaint  Patient presents with   Cough   Nausea    HPI Derrick Barnes is a 37 y.o. male.   Patient presenting today with 3-day history of cough, nausea, upper abdominal discomfort, wheezing, shortness of breath, fever, chills, sweats.  Denies chest pain, rashes, sore throat.  Taking Benadryl, over-the-counter fever reducers with minimal relief.  No known sick contacts recently.  History of asthma on albuterol as needed.    Past Medical History:  Diagnosis Date   Bipolar disorder ([place])    Diabetes mellitus without complication ([place])    GERD (gastroesophageal reflux disease)    History of COVID-19 11/21/2020   Hypertension    Sleep apnea     Patient Active Problem List   Diagnosis Date Noted   Black stool 05/05/2022   Dysphagia 05/05/2022   Heartburn 05/05/2022   Pain of upper abdomen 05/05/2022   Loose stools 05/05/2022   Low back pain 05/07/2021   Unilateral primary osteoarthritis, right knee     Past Surgical History:  Procedure Laterality Date   ABDOMINAL SURGERY     HERNIA REPAIR     2018 or 2019 in [place] Right 01/22/2021   Procedure: RIGHT KNEE ARTHROSCOPY AND DEBRIDEMENT;  Surgeon: Newt Minion, MD;  Location: [facility];  Service: Orthopedics;  Laterality: Right;       Home Medications    Prior to Admission medications   Medication Sig Start Date End Date Taking? Authorizing Provider  diphenhydrAMINE (BENADRYL) 25 MG tablet Take 1 tablet (25 mg total) by mouth every 6 (six) hours. 06/27/22  Yes Maudie Flakes, MD  ibuprofen (ADVIL) 800 MG tablet Take 800 mg by mouth every 8 (eight) hours as needed.   Yes [provider]  losartan (COZAAR) 50 MG tablet Take 50 mg by mouth daily. 11/25/21  Yes [provider]  ondansetron (ZOFRAN-ODT) 4 MG disintegrating tablet Take 1 tablet (4 mg  total) by mouth every 8 (eight) hours as needed for nausea or vomiting. 02/04/23  Yes Volney American, PA-C  predniSONE (DELTASONE) 20 MG tablet Take 2 tablets (40 mg total) by mouth daily with breakfast. 02/04/23  Yes Volney American, PA-C  albuterol (VENTOLIN HFA) 108 (90 Base) MCG/ACT inhaler Inhale 2 puffs into the lungs every 4 (four) hours as needed for wheezing or shortness of breath. 02/04/23   Volney American, PA-C  amoxicillin (AMOXIL) 875 MG tablet Take 1 tablet (875 mg total) by mouth 2 (two) times daily. 08/07/22   Volney American, PA-C  brompheniramine-pseudoephedrine-DM 30-2-10 MG/5ML syrup Take 5 mLs by mouth 4 (four) times daily as needed. 02/04/23   Volney American, PA-C  hydrocortisone cream 1 % Apply to affected area 2 times daily 06/27/22   Maudie Flakes, MD  lidocaine (XYLOCAINE) 2 % solution Use as directed 10 mLs in the mouth or throat every 6 (six) hours as needed for mouth pain. 05/07/22   Leath-Warren, Alda Lea, NP  metFORMIN (GLUCOPHAGE) 500 MG tablet Take by mouth 2 (two) times daily with a meal.    [provider]  pantoprazole (PROTONIX) 40 MG tablet TAKE 1 TABLET(40 MG) BY MOUTH TWICE DAILY BEFORE A MEAL 11/05/22   Carlan, Chelsea L, NP  Semaglutide (OZEMPIC, 1 MG/DOSE, [place]) Inject into the skin once a week.  [provider]    Family History Family History  Problem Relation Age of Onset   Diabetes Father    Cancer Father    Heart disease Father    Colon cancer Paternal Uncle        29   Inflammatory bowel disease Neg Hx    Celiac disease Neg Hx     Social History Social History   Tobacco Use   Smoking status: Every Day    Packs/day: .25    Types: Cigarettes   Smokeless tobacco: Never  Vaping Use   Vaping Use: Never used  Substance Use Topics   Alcohol use: Not Currently    Comment: occ   Drug use: Not Currently     Allergies   Alfalfa and Trulicity [dulaglutide]   Review of Systems Review  of Systems PER HPI  Physical Exam Triage Vital Signs ED Triage Vitals  Enc Vitals Group     BP 02/04/23 1659 (!) 142/107     Pulse Rate 02/04/23 1659 (!) 112     Resp 02/04/23 1659 20     Temp 02/04/23 1659 98.2 F (36.8 C)     Temp Source 02/04/23 1659 Oral     SpO2 02/04/23 1659 96 %     Weight 02/04/23 1659 (!) 362 lb (164.2 kg)     Height 02/04/23 1659 6\' 4"  (1.93 m)     Head Circumference --      Peak Flow --      Pain Score 02/04/23 1658 6     Pain Loc --      Pain Edu? --      Excl. in [place]? --    No data found.  Updated Vital Signs BP (!) 142/107 (BP Location: Right Arm)   Pulse (!) 112   Temp 98.2 F (36.8 C) (Oral)   Resp 20   Ht 6\' 4"  (1.93 m)   Wt (!) 362 lb (164.2 kg)   SpO2 96%   BMI 44.06 kg/m   Visual Acuity Right Eye Distance:   Left Eye Distance:   Bilateral Distance:    Right Eye Near:   Left Eye Near:    Bilateral Near:     Physical Exam Vitals and nursing note reviewed.  Constitutional:      Appearance: He is well-developed. He is diaphoretic.  HENT:     Head: Atraumatic.     Right Ear: External ear normal.     Left Ear: External ear normal.     Nose: Nose normal.     Mouth/Throat:     Pharynx: No oropharyngeal exudate or posterior oropharyngeal erythema.  Eyes:     Conjunctiva/sclera: Conjunctivae normal.     Pupils: Pupils are equal, round, and reactive to light.  Cardiovascular:     Rate and Rhythm: Normal rate and regular rhythm.     Heart sounds: Normal heart sounds.  Pulmonary:     Effort: Pulmonary effort is normal. No respiratory distress.     Breath sounds: Wheezing present. No rales.  Abdominal:     General: Bowel sounds are normal. There is no distension.     Palpations: Abdomen is soft.     Tenderness: There is no abdominal tenderness. There is no guarding.  Musculoskeletal:        General: Normal range of motion.     Cervical back: Normal range of motion and neck supple.  Lymphadenopathy:     Cervical: No  cervical adenopathy.  Skin:  General: Skin is warm.  Neurological:     Mental Status: He is alert and oriented to person, place, and time.  Psychiatric:        Behavior: Behavior normal.      UC Treatments / Results  Labs (all labs ordered are listed, but only abnormal results are displayed) Labs Reviewed  POCT INFLUENZA A/B    EKG   Radiology No results found.  Procedures Procedures (including critical care time)  Medications Ordered in UC Medications - No data to display  Initial Impression / Assessment and Plan / UC Course  I have reviewed the triage vital signs and the nursing notes.  Pertinent labs & imaging results that were available during my care of the patient were reviewed by me and considered in my medical decision making (see chart for details).     Suspect viral illness, rapid flu negative, declines further testing at this time.  Mildly hypertensive and tachycardic in triage, otherwise vital signs reassuring.  Viral illness causing secondary asthma exacerbation and GI symptoms.  Treat these with Bromfed, albuterol inhaler, prednisone, Zofran and supportive over-the-counter medication and home care.  Return for worsening symptoms.  Final Clinical Impressions(s) / UC Diagnoses   Final diagnoses:  Viral URI with cough  Moderate persistent asthma with acute exacerbation  Nausea   Discharge Instructions   None    ED Prescriptions     Medication Sig Dispense Auth. Provider   brompheniramine-pseudoephedrine-DM 30-2-10 MG/5ML syrup Take 5 mLs by mouth 4 (four) times daily as needed. 140 mL Volney American, [place]   albuterol (VENTOLIN HFA) 108 (90 Base) MCG/ACT inhaler Inhale 2 puffs into the lungs every 4 (four) hours as needed for wheezing or shortness of breath. 18 g Volney American, [place]   predniSONE (DELTASONE) 20 MG tablet Take 2 tablets (40 mg total) by mouth daily with breakfast. 10 tablet Volney American, PA-C   ondansetron  (ZOFRAN-ODT) 4 MG disintegrating tablet Take 1 tablet (4 mg total) by mouth every 8 (eight) hours as needed for nausea or vomiting. 20 tablet Volney American, [place]      PDMP not reviewed this encounter.   Volney American, [place] 02/04/23 1737

## 2023-02-23 ENCOUNTER — Other Ambulatory Visit: Payer: Self-pay | Admitting: Family Medicine

## 2023-02-24 NOTE — Telephone Encounter (Signed)
Urgent Care provider Requested Prescriptions  Pending Prescriptions Disp Refills   VENTOLIN HFA 108 (90 Base) MCG/ACT inhaler [Pharmacy Med Name: VENTOLIN HFA INH W/DOS CTR 200PUFFS] 18 g 0    Sig: INHALE 2 PUFFS INTO THE LUNGS EVERY 4 HOURS AS NEEDED FOR WHEEZING OR SHORTNESS OF BREATH     Pulmonology:  Beta Agonists 2 Failed - 02/23/2023  9:18 AM      Failed - Last BP in normal range    BP Readings from Last 1 Encounters:  02/04/23 (!) 142/107         Failed - Last Heart Rate in normal range    Pulse Readings from Last 1 Encounters:  02/04/23 (!) 112         Failed - Valid encounter within last 12 months    Recent Outpatient Visits   None

## 2023-02-26 ENCOUNTER — Other Ambulatory Visit: Payer: Self-pay

## 2023-02-26 ENCOUNTER — Encounter (HOSPITAL_COMMUNITY): Payer: Self-pay

## 2023-02-26 ENCOUNTER — Emergency Department (HOSPITAL_COMMUNITY)
Admission: EM | Admit: 2023-02-26 | Discharge: 2023-02-26 | Disposition: A | Discharge status: Home / Self Care | Payer: Medicaid Other | Attending: Emergency Medicine | Admitting: Emergency Medicine

## 2023-02-26 ENCOUNTER — Emergency Department (HOSPITAL_COMMUNITY): Payer: Medicaid Other

## 2023-02-26 DIAGNOSIS — E119 Type 2 diabetes mellitus without complications: Secondary | ICD-10-CM | POA: Insufficient documentation

## 2023-02-26 DIAGNOSIS — I10 Essential (primary) hypertension: Secondary | ICD-10-CM | POA: Insufficient documentation

## 2023-02-26 DIAGNOSIS — Z794 Long term (current) use of insulin: Secondary | ICD-10-CM | POA: Diagnosis not present

## 2023-02-26 DIAGNOSIS — R079 Chest pain, unspecified: Secondary | ICD-10-CM | POA: Insufficient documentation

## 2023-02-26 DIAGNOSIS — Z79899 Other long term (current) drug therapy: Secondary | ICD-10-CM | POA: Insufficient documentation

## 2023-02-26 DIAGNOSIS — Z7984 Long term (current) use of oral hypoglycemic drugs: Secondary | ICD-10-CM | POA: Insufficient documentation

## 2023-02-26 DIAGNOSIS — R6884 Jaw pain: Secondary | ICD-10-CM | POA: Diagnosis not present

## 2023-02-26 LAB — BASIC METABOLIC PANEL
Anion gap: 8 (ref 5–15)
BUN: 15 mg/dL (ref 6–20)
CO2: 22 mmol/L (ref 22–32)
Calcium: 8.7 mg/dL — ABNORMAL LOW (ref 8.9–10.3)
Chloride: 103 mmol/L (ref 98–111)
Creatinine, Ser: 0.89 mg/dL (ref 0.61–1.24)
GFR, Estimated: >60 mL/min (ref >60)
Glucose, Bld: 255 mg/dL — ABNORMAL HIGH (ref 70–99)
Potassium: 3.8 mmol/L (ref 3.5–5.1)
Sodium: 133 mmol/L — ABNORMAL LOW (ref 135–145)

## 2023-02-26 LAB — CBC
HCT: 40.4 % (ref 39.0–52.0)
Hemoglobin: 14.7 g/dL (ref 13.0–17.0)
MCH: 31.4 pg (ref 26.0–34.0)
MCHC: 36.4 g/dL — ABNORMAL HIGH (ref 30.0–36.0)
MCV: 86.3 fL (ref 80.0–100.0)
Platelets: 191 10*3/uL (ref 150–400)
RBC: 4.68 MIL/uL (ref 4.22–5.81)
RDW: 12.6 % (ref 11.5–15.5)
WBC: 6.5 10*3/uL (ref 4.0–10.5)
nRBC: 0 % (ref 0.0–0.2)

## 2023-02-26 LAB — TROPONIN I (HIGH SENSITIVITY)
Troponin I (High Sensitivity): 4 ng/L (ref <18)
Troponin I (High Sensitivity): 4 ng/L (ref <18)

## 2023-02-26 MED ORDER — KETOROLAC TROMETHAMINE 15 MG/ML IJ SOLN
15.0000 mg | Freq: Once | INTRAMUSCULAR | Status: AC
  Administered 2023-02-26: 15 mg via INTRAVENOUS
  Filled 2023-02-26: qty 1

## 2023-02-26 MED ORDER — ASPIRIN 81 MG PO CHEW
324.0000 mg | CHEWABLE_TABLET | Freq: Once | ORAL | Status: AC
  Administered 2023-02-26: 324 mg via ORAL
  Filled 2023-02-26: qty 4

## 2023-02-26 NOTE — ED Triage Notes (Signed)
Patient comes in with complaints of lower jaw pain for the past couple of days. Patient states able to drink but unable to tolerate eating okay. Patient states that the lower lip will swell, pt states knots under jaw. Pt also states left upper abdomen pain. Patient states able to pass gas but has not had a BM in over 2 days. Patient states he normally is able to have a BM 3 x's a day. Patient states able to urinate with slight burning sensation.

## 2023-02-26 NOTE — ED Provider Notes (Signed)
[place] EMERGENCY DEPARTMENT AT [facility] Provider Note   CSN: [id number] Arrival date & time: 02/26/23  7846     History  Chief Complaint  Patient presents with   Rib and jaw pain    Derrick Barnes is a 37 y.o. male.  HPI Patient has a history of dysphagia, hypertension, diabetes who presents to the ED with complaints of jaw and chest discomfort.  Patient states he has been having pain of left lower rib area anteriorly as well as pain in his left jaw.  Patient states these episodes are intermittent.  Nothing in particular precipitates them and they will occur at rest.  The episodes last maybe 10 to 15 minutes.  Patient denies any abdominal pain.  He is not having any trouble with his appetite.  He denies any vomiting or diarrhea.  Patient denies any history of heart disease.    Home Medications Prior to Admission medications   Medication Sig Start Date End Date Taking? Authorizing Provider  albuterol (VENTOLIN HFA) 108 (90 Base) MCG/ACT inhaler Inhale 2 puffs into the lungs every 4 (four) hours as needed for wheezing or shortness of breath. 02/04/23  Yes Particia Nearing, PA-C  ibuprofen (ADVIL) 800 MG tablet Take 800 mg by mouth every 8 (eight) hours as needed.   Yes [provider]  amoxicillin (AMOXIL) 875 MG tablet Take 1 tablet (875 mg total) by mouth 2 (two) times daily. Patient not taking: Reported on 02/26/2023 08/07/22   Particia Nearing, PA-C  brompheniramine-pseudoephedrine-DM 30-2-10 MG/5ML syrup Take 5 mLs by mouth 4 (four) times daily as needed. Patient not taking: Reported on 02/26/2023 02/04/23   Particia Nearing, PA-C  diphenhydrAMINE (BENADRYL) 25 MG tablet Take 1 tablet (25 mg total) by mouth every 6 (six) hours. Patient not taking: Reported on 02/26/2023 06/27/22   Sabas Sous, MD  hydrocortisone cream 1 % Apply to affected area 2 times daily Patient not taking: Reported on 02/26/2023 06/27/22   Sabas Sous, MD  lidocaine  (XYLOCAINE) 2 % solution Use as directed 10 mLs in the mouth or throat every 6 (six) hours as needed for mouth pain. Patient not taking: Reported on 02/26/2023 05/07/22   Leath-Warren, Sadie Haber, NP  losartan (COZAAR) 50 MG tablet Take 50 mg by mouth daily. Patient not taking: Reported on 02/26/2023 11/25/21   [provider]  metFORMIN (GLUCOPHAGE) 500 MG tablet Take by mouth 2 (two) times daily with a meal. Patient not taking: Reported on 02/26/2023    [provider]  ondansetron (ZOFRAN-ODT) 4 MG disintegrating tablet Take 1 tablet (4 mg total) by mouth every 8 (eight) hours as needed for nausea or vomiting. Patient not taking: Reported on 02/26/2023 02/04/23   Particia Nearing, PA-C  pantoprazole (PROTONIX) 40 MG tablet TAKE 1 TABLET(40 MG) BY MOUTH TWICE DAILY BEFORE A MEAL Patient not taking: Reported on 02/26/2023 11/05/22   Raquel James, NP  predniSONE (DELTASONE) 20 MG tablet Take 2 tablets (40 mg total) by mouth daily with breakfast. Patient not taking: Reported on 02/26/2023 02/04/23   Particia Nearing, PA-C  Semaglutide (OZEMPIC, 1 MG/DOSE, [place]) Inject into the skin once a week. Patient not taking: Reported on 02/26/2023    [provider]      Allergies    Alfalfa and Trulicity [dulaglutide]    Review of Systems   Review of Systems  Physical Exam Updated Vital Signs BP (!) 145/76 (BP Location: Right Arm)   Pulse 65  Temp 98.1 F (36.7 C) (Oral)   Resp 19   Ht 1.93 m ( )   Wt (!) 158.8 kg   SpO2 100%   BMI 42.60 kg/m  Physical Exam Vitals and nursing note reviewed.  Constitutional:      General: He is not in acute distress.    Appearance: He is well-developed.  HENT:     Head: Normocephalic and atraumatic.     Right Ear: External ear normal.     Left Ear: External ear normal.     Mouth/Throat:     Mouth: Mucous membranes are moist.     Pharynx: No pharyngeal swelling.  Eyes:     General: No scleral icterus.       Right  eye: No discharge.        Left eye: No discharge.     Conjunctiva/sclera: Conjunctivae normal.  Neck:     Trachea: No tracheal deviation.  Cardiovascular:     Rate and Rhythm: Regular rhythm.  Pulmonary:     Effort: Pulmonary effort is normal. No respiratory distress.     Breath sounds: Normal breath sounds. No stridor. No wheezing or rales.  Abdominal:     General: Bowel sounds are normal. There is no distension.     Palpations: Abdomen is soft.     Tenderness: There is no abdominal tenderness. There is no left CVA tenderness, guarding or rebound.  Musculoskeletal:        General: No tenderness or deformity.     Cervical back: Neck supple. No erythema. Normal range of motion.  Skin:    General: Skin is warm and dry.     Findings: No rash.  Neurological:     General: No focal deficit present.     Mental Status: He is alert.     Cranial Nerves: No cranial nerve deficit, dysarthria or facial asymmetry.     Sensory: No sensory deficit.     Motor: No abnormal muscle tone or seizure activity.     Coordination: Coordination normal.  Psychiatric:        Mood and Affect: Mood normal.     ED Results / Procedures / Treatments   Labs (all labs ordered are listed, but only abnormal results are displayed) Labs Reviewed  BASIC METABOLIC PANEL - Abnormal; Notable for the following components:      Result Value   Sodium 133 (*)    Glucose, Bld 255 (*)    Calcium 8.7 (*)    All other components within normal limits  CBC - Abnormal; Notable for the following components:   MCHC 36.4 (*)    All other components within normal limits  TROPONIN I (HIGH SENSITIVITY)  TROPONIN I (HIGH SENSITIVITY)    EKG EKG Interpretation  Date/Time:  Thursday February 26 2023 09:30:46 EDT Ventricular Rate:  93 PR Interval:  139 QRS Duration: 104 QT Interval:  354 QTC Calculation: 441 R Axis:   85 Text Interpretation: Sinus rhythm No significant change since last tracing Confirmed by Linwood Dibbles  [phone]) on 02/26/2023 9:34:26 AM  Radiology DG Chest 2 View  Result Date: 02/26/2023 CLINICAL DATA:  37 year old male with left side chest and jaw pain. Smoker. EXAM: CHEST - 2 VIEW COMPARISON:  Chest radiographs 12/13/2022. FINDINGS: Lung volumes and mediastinal contours remain normal. Visualized tracheal air column is within normal limits. Stable probably smoking related symmetric pulmonary interstitial markings. Otherwise the lungs appear clear. No pneumothorax or pleural effusion. No osseous abnormality identified.  Negative visible bowel  gas. IMPRESSION: No acute cardiopulmonary abnormality. Electronically Signed   By: Odessa Fleming M.D.   On: 02/26/2023 10:37    Procedures Procedures    Medications Ordered in ED Medications  aspirin chewable tablet 324 mg (324 mg Oral Given 02/26/23 0955)  ketorolac (TORADOL) 15 MG/ML injection 15 mg (15 mg Intravenous Given 02/26/23 1102)    ED Course/ Medical Decision Making/ A&P Clinical Course as of 02/27/23 0659  Thu Feb 26, 2023  1043 CBC(!) CBC normal.  Metabolic panel shows hyperglycemia.  Troponin normal [JK]  1044 DG Chest 2 View Chest x-ray without acute findings [JK]  1234 Delta troponin is normal [JK]    Clinical Course User Index [JK] Linwood Dibbles, MD             HEART Score: 3                Medical Decision Making Problems Addressed: Chest pain, unspecified type: acute illness or injury that poses a threat to life or bodily functions  Amount and/or Complexity of Data Reviewed Labs: ordered. Decision-making details documented in ED Course. Radiology: ordered and independent interpretation performed. Decision-making details documented in ED Course.  Risk OTC drugs. Prescription drug management.   Pt presented for evaluation of chest pain.  Cardiac risk factors, no known history of CAD.  ED workup reassuring.  Doubt ACS.  No pna, ptx.  Low suspicion for PE, dissection.  Plan on outpt cardiology follow up  Evaluation and  diagnostic testing in the emergency department does not suggest an emergent condition requiring admission or immediate intervention beyond what has been performed at this time.  The patient is safe for discharge and has been instructed to return immediately for worsening symptoms, change in symptoms or any other concerns.         Final Clinical Impression(s) / ED Diagnoses Final diagnoses:  Chest pain, unspecified type    Rx / DC Orders ED Discharge Orders          Ordered    Ambulatory referral to Cardiology       Comments: If you have not heard from the Cardiology office within the next 72 hours please call [phone].   02/26/23 1254              Linwood Dibbles, MD 02/27/23 0700

## 2023-02-26 NOTE — Discharge Instructions (Addendum)
The test today in the ED were reassuring.  Follow-up with the cardiologist for further evaluation.   Return to the ER as needed for worsening symptoms.

## 2023-03-17 ENCOUNTER — Encounter: Payer: Self-pay | Admitting: Internal Medicine

## 2023-03-17 ENCOUNTER — Ambulatory Visit: Payer: Medicaid Other | Attending: Internal Medicine | Admitting: Internal Medicine

## 2023-03-17 VITALS — BP 146/82 | HR 86 | Ht 76.0 in | Wt 357.4 lb

## 2023-03-17 DIAGNOSIS — I10 Essential (primary) hypertension: Secondary | ICD-10-CM

## 2023-03-17 DIAGNOSIS — G4733 Obstructive sleep apnea (adult) (pediatric): Secondary | ICD-10-CM | POA: Diagnosis not present

## 2023-03-17 DIAGNOSIS — R634 Abnormal weight loss: Secondary | ICD-10-CM | POA: Diagnosis not present

## 2023-03-17 DIAGNOSIS — R079 Chest pain, unspecified: Secondary | ICD-10-CM | POA: Insufficient documentation

## 2023-03-17 DIAGNOSIS — Z720 Tobacco use: Secondary | ICD-10-CM

## 2023-03-17 MED ORDER — LOSARTAN POTASSIUM 100 MG PO TABS: 100.0000 mg | ORAL_TABLET | Freq: Every day | ORAL | 1 refills | Status: AC

## 2023-03-17 MED ORDER — BLOOD PRESSURE KIT: 1.0000 | PACK | 0 refills | Status: AC

## 2023-03-17 NOTE — Progress Notes (Signed)
Cardiology Office Note  Date: 03/17/2023   ID: Derrick Barnes, DOB [birth year], MRN [id number]  PCP:  [facility]  Cardiologist:  Marjo Bicker, MD Electrophysiologist:  None   Reason for Office Visit: Management of chest pains at the request of Dr Lynelle Doctor   History of Present Illness: Derrick Barnes is a 37 y.o. male known to have HTN, DM 2, OSA not on CPAP was referred to cardiology clinic for management of chest pains.  Patient had recent ER visit on 02/26/2023 for chest pains.  EKG showed no ischemia, troponins within normal limits.  He was eventually sent to cardiology clinic for outpatient management of chest pain. Patient has been having chest pains few times per week, lasting for 1 hour, occurs at both rest and exertion. Has been going on for quite some time. He does not check his blood pressures at home.  Denies DOE, dizziness, palpitations or leg swelling.  He works out, performs push-ups and pull-ups.  Although he has been doing this for quite a while, he recently noticed unintentional weight loss of around 30 pounds in the last few months.  He changed multiple PCPs and is not satisfied with HTN management.  He was told he had diabetes in the past, was placed on metformin and was taken off of it eventually.  He currently smokes cigarettes almost 1 pack/day and cut down to 3 cigarettes/day. Due to stress, he is unable to quit smoking and he borrow cigarettes from his girlfriend.   Past Medical History:  Diagnosis Date   Bipolar disorder (HCC)    Diabetes mellitus without complication (HCC)    GERD (gastroesophageal reflux disease)    History of COVID-19 11/21/2020   Hypertension    Sleep apnea     Past Surgical History:  Procedure Laterality Date   ABDOMINAL SURGERY     HERNIA REPAIR     2018 or 2019 in [place]   KNEE ARTHROSCOPY Right 01/22/2021   Procedure: RIGHT KNEE ARTHROSCOPY AND DEBRIDEMENT;  Surgeon: Nadara Mustard, MD;  Location: [facility];  Service: Orthopedics;  Laterality: Right;    Current Outpatient Medications  Medication Sig Dispense Refill   albuterol (VENTOLIN HFA) 108 (90 Base) MCG/ACT inhaler Inhale 2 puffs into the lungs every 4 (four) hours as needed for wheezing or shortness of breath. 18 g 0   Blood Pressure KIT 1 each by Does not apply route as directed. Dx: hypertension 1 kit 0   losartan (COZAAR) 100 MG tablet Take 1 tablet (100 mg total) by mouth daily. 90 tablet 1   No current facility-administered medications for this visit.   Allergies:  Alfalfa and Trulicity [dulaglutide]   Social History: The patient  reports that he has been smoking cigarettes. He has been smoking an average of .25 packs per day. He has never used smokeless tobacco. He reports that he does not currently use alcohol. He reports that he does not currently use drugs.   Family History: The patient's family history includes Cancer in his father; Colon cancer in his paternal uncle; Diabetes in his father; Heart disease in his father.   ROS:  Please see the history of present illness. Otherwise, complete review of systems is positive for none  All other systems are reviewed and negative.   Physical Exam: VS:  BP (!) 146/82   Pulse 86   Ht 6\' 4"  (1.93 m)   Wt (!) 357 lb 6.4 oz (162.1 kg)   SpO2  97%   BMI 43.50 kg/m , BMI Body mass index is 43.5 kg/m.  Wt Readings from Last 3 Encounters:  03/17/23 (!) 357 lb 6.4 oz (162.1 kg)  02/26/23 (!) 350 lb (158.8 kg)  02/04/23 (!) 362 lb (164.2 kg)    General: Patient appears comfortable at rest. HEENT: Conjunctiva and lids normal, oropharynx clear with moist mucosa. Neck: Supple, no elevated JVP or carotid bruits, no thyromegaly. Lungs: Clear to auscultation, nonlabored breathing at rest. Cardiac: Regular rate and rhythm, no S3 or significant systolic murmur, no pericardial rub. Abdomen: Soft, nontender, no hepatomegaly, bowel sounds present, no guarding or rebound. Extremities: No  pitting edema, distal pulses 2+. Skin: Warm and dry. Musculoskeletal: No kyphosis. Neuropsychiatric: Alert and oriented x3, affect grossly appropriate.  Recent Labwork: 01/07/2023: ALT 26; AST 24 02/26/2023: BUN 15; Creatinine, Ser 0.89; Hemoglobin 14.7; Platelets 191; Potassium 3.8; Sodium 133  No results found for: "CHOL", "TRIG", "HDL", "CHOLHDL", "VLDL", "LDLCALC", "LDLDIRECT"  Other Studies Reviewed Today:   Assessment and Plan:  Patient is a 37 year old M known to have HTN, DM 2, OSA not on CPAP was referred to cardiology clinic for management of chest pain.  # Chest pain likely secondary to poorly controlled HTN: Patient has chest pains both at rest and exertion and last for at least 1 hour.  Occurs few times per week. He does not check his blood pressure at home and this could probably secondary to poorly controlled HTN. He has knee issues and hence cannot perform treadmill exercise EKG.  He is on a limited income and does not want to do CT calcium scoring of coronaries. Will reassess his symptoms in 3 months after his HTN is adequately controlled.  # HTN, poorly controlled -Increase losartan from 50 mg to 100 mg once daily -He does not check blood pressures at home. Prescribed a blood pressure machine.  Instructed him to check blood pressures a.m. and p.m. -Obtain 2D echocardiogram  # OSA not on CPAP -Patient moved from [place]. He lost his CPAP in the move. Will refer to sleep medicine to obtain sleep study and prescribe CPAP.  # Unintentional weight loss -Obtain TSH  # Nicotine abuse -Smoking cessation counseling provided, patient borrow cigarettes from his girlfriend. Smoking cessation instruction/counseling given:  counseled patient on the dangers of tobacco use, advised patient to stop smoking, and reviewed strategies to maximize success    I have spent a total of 45 minutes with patient reviewing chart, EKGs, labs and examining patient as well as establishing an  assessment and plan that was discussed with the patient.  > 50% of time was spent in direct patient care.    Medication Adjustments/Labs and Tests Ordered: Current medicines are reviewed at length with the patient today.  Concerns regarding medicines are outlined above.   Tests Ordered: Orders Placed This Encounter  Procedures   TSH   Ambulatory referral to Pulmonology   ECHOCARDIOGRAM COMPLETE    Medication Changes: Meds ordered this encounter  Medications   losartan (COZAAR) 100 MG tablet    Sig: Take 1 tablet (100 mg total) by mouth daily.    Dispense:  90 tablet    Refill:  1    03/17/2023 dose increase   Blood Pressure KIT    Sig: 1 each by Does not apply route as directed. Dx: hypertension    Dispense:  1 kit    Refill:  0    Disposition:  Follow up  3 months  Signed Macyn Remmert Verne Spurr, MD,  03/17/2023 2:27 PM    [facility] at [facility] [address] [place], [place], [place] [address]

## 2023-03-17 NOTE — Patient Instructions (Addendum)
Medication Instructions:  Your physician has recommended you make the following change in your medication:  Increase losartan to 100 mg daily Continue other medications the same  Labwork: TSH 03/18/2023 at [facility] ([address] [place]. ) Non-fasting  Testing/Procedures: Your physician has requested that you have an echocardiogram. Echocardiography is a painless test that uses sound waves to create images of your heart. It provides your doctor with information about the size and shape of your heart and how well your heart's chambers and valves are working. This procedure takes approximately one hour. There are no restrictions for this procedure. Please do NOT wear cologne, perfume, aftershave, or lotions (deodorant is allowed). Please arrive 15 minutes prior to your appointment time.  Follow-Up: Your physician recommends that you schedule a follow-up appointment in: 3 months  Any Other Special Instructions Will Be Listed Below (If Applicable). You have been referred to [facility]  If you need a refill on your cardiac medications before your next appointment, please call your pharmacy.

## 2023-03-19 LAB — TSH: TSH: 2.9 u[IU]/mL (ref 0.450–4.500)

## 2023-03-24 ENCOUNTER — Encounter: Payer: Self-pay | Admitting: *Deleted

## 2023-03-31 ENCOUNTER — Institutional Professional Consult (permissible substitution): Payer: Medicaid Other | Admitting: Primary Care

## 2023-04-20 ENCOUNTER — Institutional Professional Consult (permissible substitution): Payer: Medicaid Other | Admitting: Internal Medicine

## 2023-04-20 NOTE — Progress Notes (Deleted)
04/20/23- 36 yoM Smoker (0.25 pk/day) for sleep evaluation courtesy of Dr Jenene Slicker (Cardiology) with concern of OSA Medical problem list includes HTN, GERD, DM, Tobacco Use, Bipolar,  Epworth score- Body weight today-

## 2023-04-24 ENCOUNTER — Ambulatory Visit (HOSPITAL_COMMUNITY)
Admission: RE | Admit: 2023-04-24 | Discharge: 2023-04-24 | Disposition: A | Discharge status: Home / Self Care | Payer: Medicaid Other | Source: Ambulatory Visit | Attending: Internal Medicine | Admitting: Internal Medicine

## 2023-04-24 DIAGNOSIS — I10 Essential (primary) hypertension: Secondary | ICD-10-CM | POA: Insufficient documentation

## 2023-04-24 LAB — ECHOCARDIOGRAM COMPLETE
Area-P 1/2: 3.21 cm2
S' Lateral: 3 cm

## 2023-04-24 NOTE — Progress Notes (Signed)
*  PRELIMINARY RESULTS* Echocardiogram 2D Echocardiogram has been performed.  Stacey Drain 04/24/2023, 11:55 AM

## 2023-04-28 ENCOUNTER — Telehealth: Payer: Self-pay

## 2023-04-28 NOTE — Telephone Encounter (Signed)
Patient notified and verbalized understanding. Patient had no questions or concerns at this time. PCP copied 

## 2023-04-28 NOTE — Telephone Encounter (Signed)
-----   Message from Marjo Bicker, MD sent at 04/28/2023 12:26 PM EDT ----- Normal pumping function of the heart and no valvular heart disease.  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  Obtain CTA aorta in 1 year.

## 2023-06-22 ENCOUNTER — Encounter: Payer: Medicaid Other | Admitting: Internal Medicine

## 2023-06-22 NOTE — Progress Notes (Signed)
Erroneous encounter - please disregard.

## 2023-08-08 ENCOUNTER — Ambulatory Visit
Admission: EM | Admit: 2023-08-08 | Discharge: 2023-08-08 | Disposition: A | Discharge status: Home / Self Care | Payer: Medicaid Other | Attending: Internal Medicine | Admitting: Internal Medicine

## 2023-08-08 DIAGNOSIS — F17210 Nicotine dependence, cigarettes, uncomplicated: Secondary | ICD-10-CM | POA: Insufficient documentation

## 2023-08-08 DIAGNOSIS — J028 Acute pharyngitis due to other specified organisms: Secondary | ICD-10-CM | POA: Diagnosis present

## 2023-08-08 DIAGNOSIS — F17200 Nicotine dependence, unspecified, uncomplicated: Secondary | ICD-10-CM

## 2023-08-08 DIAGNOSIS — B9789 Other viral agents as the cause of diseases classified elsewhere: Secondary | ICD-10-CM | POA: Diagnosis present

## 2023-08-08 DIAGNOSIS — Z1152 Encounter for screening for COVID-19: Secondary | ICD-10-CM | POA: Diagnosis not present

## 2023-08-08 DIAGNOSIS — J209 Acute bronchitis, unspecified: Secondary | ICD-10-CM | POA: Insufficient documentation

## 2023-08-08 DIAGNOSIS — R0602 Shortness of breath: Secondary | ICD-10-CM | POA: Diagnosis not present

## 2023-08-08 LAB — POCT RAPID STREP A (OFFICE): Rapid Strep A Screen: NEGATIVE

## 2023-08-08 MED ORDER — BENZONATATE 100 MG PO CAPS: 100.0000 mg | ORAL_CAPSULE | Freq: Three times a day (TID) | ORAL | 0 refills | Status: AC

## 2023-08-08 MED ORDER — ALBUTEROL SULFATE HFA 108 (90 BASE) MCG/ACT IN AERS
1.0000 | INHALATION_SPRAY | Freq: Four times a day (QID) | RESPIRATORY_TRACT | 0 refills | Status: AC | PRN
Start: 2023-08-08 — End: ?

## 2023-08-08 MED ORDER — ALBUTEROL SULFATE (2.5 MG/3ML) 0.083% IN NEBU
2.5000 mg | INHALATION_SOLUTION | Freq: Once | RESPIRATORY_TRACT | Status: AC
  Administered 2023-08-08: 2.5 mg via RESPIRATORY_TRACT

## 2023-08-08 MED ORDER — PREDNISONE 20 MG PO TABS: 20.0000 mg | ORAL_TABLET | Freq: Every day | ORAL | 0 refills | Status: AC

## 2023-08-08 NOTE — ED Provider Notes (Signed)
[facility]    CSN: [id number] Arrival date & time: 08/08/23  4098      History   Chief Complaint No chief complaint on file.   HPI Derrick Barnes is a 37 y.o. male.   Derrick Barnes is a 37 y.o. male presenting for chief complaint of sore throat, nasal congestion, cough, intermittent shortness of breath, and chest discomfort associated with cough that started 2 days ago.  Reports shortness of breath at baseline secondary to cigarette smoking, no worsening shortness of breath than usual reported.  He has heard some wheezing to the chest at nighttime associated with cough.  No history of asthma or chronic respiratory problems.  He is to have access to an albuterol inhaler at home but does not have access to this anymore.  Current everyday cigarette smoker, denies other drug use.  No nausea, vomiting, diarrhea, abdominal pain, dizziness, rash, or fever/chills associated with illness.  Currently taking amoxicillin twice daily for dental infection otherwise no recent antibiotic/steroid use.  Has not attempted use of any over-the-counter medications to help with symptoms PTA.     Past Medical History:  Diagnosis Date   Bipolar disorder (HCC)    Diabetes mellitus without complication (HCC)    GERD (gastroesophageal reflux disease)    History of COVID-19 11/21/2020   Hypertension    Sleep apnea     Patient Active Problem List   Diagnosis Date Noted   ERRONEOUS ENCOUNTER--DISREGARD 06/22/2023   Chest pain of uncertain etiology 03/17/2023   OSA (obstructive sleep apnea) 03/17/2023   Weight loss 03/17/2023   HTN (hypertension) 03/17/2023   Nicotine abuse 03/17/2023   Black stool 05/05/2022   Dysphagia 05/05/2022   Heartburn 05/05/2022   Pain of upper abdomen 05/05/2022   Loose stools 05/05/2022   Low back pain 05/07/2021   Unilateral primary osteoarthritis, right knee     Past Surgical History:  Procedure Laterality Date   ABDOMINAL SURGERY     HERNIA REPAIR      2018 or 2019 in [place]   KNEE ARTHROSCOPY Right 01/22/2021   Procedure: RIGHT KNEE ARTHROSCOPY AND DEBRIDEMENT;  Surgeon: Nadara Mustard, MD;  Location: [facility];  Service: Orthopedics;  Laterality: Right;       Home Medications    Prior to Admission medications   Medication Sig Start Date End Date Taking? Authorizing Provider  albuterol (VENTOLIN HFA) 108 (90 Base) MCG/ACT inhaler Inhale 1-2 puffs into the lungs every 6 (six) hours as needed for wheezing or shortness of breath. 08/08/23  Yes Carlisle Beers, FNP  amoxicillin (AMOXIL) 500 MG tablet Take 500 mg by mouth 3 (three) times daily. 08/03/23  Yes [provider]  benzonatate (TESSALON) 100 MG capsule Take 1 capsule (100 mg total) by mouth every 8 (eight) hours. 08/08/23  Yes Carlisle Beers, FNP  ibuprofen (ADVIL) 400 MG tablet Take 400 mg by mouth every 6 (six) hours as needed. 08/03/23  Yes [provider]  predniSONE (DELTASONE) 20 MG tablet Take 1 tablet (20 mg total) by mouth daily for 5 days. 08/08/23 08/13/23 Yes Carlisle Beers, FNP  Blood Pressure KIT 1 each by Does not apply route as directed. Dx: hypertension 03/17/23   Mallipeddi, Vishnu P, MD  losartan (COZAAR) 100 MG tablet Take 1 tablet (100 mg total) by mouth daily. 03/17/23   Mallipeddi, Orion Modest, MD    Family History Family History  Problem Relation Age of Onset   Diabetes Father    Cancer  Father    Heart disease Father    Colon cancer Paternal Uncle        14   Inflammatory bowel disease Neg Hx    Celiac disease Neg Hx     Social History Social History   Tobacco Use   Smoking status: Every Day    Current packs/day: 0.25    Types: Cigarettes   Smokeless tobacco: Never  Vaping Use   Vaping status: Never Used  Substance Use Topics   Alcohol use: Not Currently    Comment: occ   Drug use: Not Currently     Allergies   Alfalfa and Trulicity [dulaglutide]   Review of Systems Review of Systems Per  HPI  Physical Exam Triage Vital Signs ED Triage Vitals  Encounter Vitals Group     BP 08/08/23 0955 (!) 152/97     Systolic BP Percentile --      Diastolic BP Percentile --      Pulse Rate 08/08/23 0955 80     Resp 08/08/23 0955 20     Temp 08/08/23 0955 97.8 F (36.6 C)     Temp Source 08/08/23 0955 Oral     SpO2 08/08/23 0955 94 %     Weight --      Height --      Head Circumference --      Peak Flow --      Pain Score 08/08/23 0957 6     Pain Loc --      Pain Education --      Exclude from Growth Chart --    No data found.  Updated Vital Signs BP (!) 152/97 (BP Location: Right Arm)   Pulse 80   Temp 97.8 F (36.6 C) (Oral)   Resp 20   SpO2 94%   Visual Acuity Right Eye Distance:   Left Eye Distance:   Bilateral Distance:    Right Eye Near:   Left Eye Near:    Bilateral Near:     Physical Exam Vitals and nursing note reviewed.  Constitutional:      Appearance: He is obese. He is ill-appearing. He is not toxic-appearing.  HENT:     Head: Normocephalic and atraumatic.     Right Ear: Hearing, tympanic membrane, ear canal and external ear normal.     Left Ear: Hearing, tympanic membrane, ear canal and external ear normal.     Nose: Nose normal.     Mouth/Throat:     Lips: Pink.     Mouth: Mucous membranes are moist. No injury.     Tongue: No lesions. Tongue does not deviate from midline.     Palate: No mass and lesions.     Pharynx: Oropharynx is clear. Uvula midline. No pharyngeal swelling, oropharyngeal exudate, posterior oropharyngeal erythema or uvula swelling.     Tonsils: No tonsillar exudate or tonsillar abscesses.  Eyes:     General: Lids are normal. Vision grossly intact. Gaze aligned appropriately.     Extraocular Movements: Extraocular movements intact.     Conjunctiva/sclera: Conjunctivae normal.  Cardiovascular:     Rate and Rhythm: Normal rate and regular rhythm.     Pulses: Normal pulses.     Heart sounds: Normal heart sounds, S1 normal  and S2 normal.  Pulmonary:     Effort: Pulmonary effort is normal. No respiratory distress.     Breath sounds: Normal air entry. Wheezing (Diffuse expiratory wheezing heard throughout all lung fields bilaterally) present. No rhonchi or rales.  Musculoskeletal:     Cervical back: Neck supple.     Right lower leg: No edema.     Left lower leg: No edema.  Lymphadenopathy:     Cervical: No cervical adenopathy.  Skin:    General: Skin is warm and dry.     Capillary Refill: Capillary refill takes less than 2 seconds.     Findings: No rash.  Neurological:     General: No focal deficit present.     Mental Status: He is alert and oriented to person, place, and time. Mental status is at baseline.     Cranial Nerves: No dysarthria or facial asymmetry.  Psychiatric:        Mood and Affect: Mood normal.        Speech: Speech normal.        Behavior: Behavior normal.        Thought Content: Thought content normal.        Judgment: Judgment normal.      UC Treatments / Results  Labs (all labs ordered are listed, but only abnormal results are displayed) Labs Reviewed  SARS CORONAVIRUS 2 (TAT 6-24 HRS)  POCT RAPID STREP A (OFFICE)    EKG   Radiology No results found.  Procedures Procedures (including critical care time)  Medications Ordered in UC Medications  albuterol (PROVENTIL) (2.5 MG/3ML) 0.083% nebulizer solution 2.5 mg (2.5 mg Nebulization Given 08/08/23 1010)    Initial Impression / Assessment and Plan / UC Course  I have reviewed the triage vital signs and the nursing notes.  Pertinent labs & imaging results that were available during my care of the patient were reviewed by me and considered in my medical decision making (see chart for details).   1.  Acute bronchitis, sore throat, shortness of breath, nicotine dependence without complication Presentation is consistent with acute viral bronchitis.  Patient non-toxic in appearance, vital signs hemodynamically stable, no  new oxygen requirement, therefore deferred imaging.  Strep/Viral testing: Strep testing ordered by nursing staff is negative.  COVID-19 testing is pending, patient may have antiviral if test positive.  Discussed CDC guidelines.  Will treat with steroid, bronchodilator, cough suppressants for symptomatic relief, and expectorants (mucinex) as needed.   Prednisone burst 20 mg for 5 days to be started today.  Reduce dose of prednisone burst sent due to history of type 2 diabetes.  Unable to see recent hemoglobin A1c in system. Symptoms and lung sounds improved after albuterol breathing treatment in clinic.  Counseled patient on potential for adverse effects with medications prescribed/recommended today, strict ER and return-to-clinic precautions discussed, patient verbalized understanding.    Final Clinical Impressions(s) / UC Diagnoses   Final diagnoses:  Acute bronchitis, unspecified organism  Sore throat (viral)  Shortness of breath  Nicotine dependence, uncomplicated, unspecified nicotine product type     Discharge Instructions      You have bronchitis which is inflammation of the upper airways in your lungs due to a virus. The following medicines will help with your symptoms.   - Take steroid pills sent to pharmacy as directed. Do not take any other NSAID containing medications such as ibuprofen or naproxen/Aleve while taking prednisone. - You may use albuterol inhaler 1 to 2 puffs every 4-6 hours as needed for cough, shortness of breath, and wheezing. - Take cough medicines as prescribed.  - Continue using over the counter medicines as needed as directed. Plain mucinex (guaifenesin) over the counter may further help breakup mucus and help with symptoms.  If you develop any new or worsening symptoms or do not improve in the next 2 to 3 days, please return.  If your symptoms are severe, please go to the emergency room. Follow-up with PCP as needed.     ED Prescriptions      Medication Sig Dispense Auth. Provider   albuterol (VENTOLIN HFA) 108 (90 Base) MCG/ACT inhaler Inhale 1-2 puffs into the lungs every 6 (six) hours as needed for wheezing or shortness of breath. 8 g Reita May M, FNP   predniSONE (DELTASONE) 20 MG tablet Take 1 tablet (20 mg total) by mouth daily for 5 days. 5 tablet Reita May M, FNP   benzonatate (TESSALON) 100 MG capsule Take 1 capsule (100 mg total) by mouth every 8 (eight) hours. 21 capsule Carlisle Beers, FNP      PDMP not reviewed this encounter.   Carlisle Beers, [place] 08/08/23 1017

## 2023-08-08 NOTE — ED Triage Notes (Signed)
Pt reports sore throat x 2 days    Pt is currently on day 5 of amoxicillin for a dental problem

## 2023-08-08 NOTE — Discharge Instructions (Addendum)
You have bronchitis which is inflammation of the upper airways in your lungs due to a virus. The following medicines will help with your symptoms.   - Take steroid pills sent to pharmacy as directed. Do not take any other NSAID containing medications such as ibuprofen or naproxen/Aleve while taking prednisone. - You may use albuterol inhaler 1 to 2 puffs every 4-6 hours as needed for cough, shortness of breath, and wheezing. - Take cough medicines as prescribed.  - Continue using over the counter medicines as needed as directed. Plain mucinex (guaifenesin) over the counter may further help breakup mucus and help with symptoms.   If you develop any new or worsening symptoms or do not improve in the next 2 to 3 days, please return.  If your symptoms are severe, please go to the emergency room. Follow-up with PCP as needed.

## 2023-08-10 LAB — SARS CORONAVIRUS 2 (TAT 6-24 HRS): SARS Coronavirus 2: NEGATIVE

## 2023-08-25 ENCOUNTER — Ambulatory Visit
Admission: EM | Admit: 2023-08-25 | Discharge: 2023-08-25 | Disposition: A | Discharge status: Home / Self Care | Payer: Medicaid Other | Attending: Nurse Practitioner | Admitting: Nurse Practitioner

## 2023-08-25 ENCOUNTER — Other Ambulatory Visit: Payer: Self-pay

## 2023-08-25 ENCOUNTER — Encounter: Payer: Self-pay | Admitting: Emergency Medicine

## 2023-08-25 DIAGNOSIS — L568 Other specified acute skin changes due to ultraviolet radiation: Secondary | ICD-10-CM | POA: Diagnosis not present

## 2023-08-25 DIAGNOSIS — H5712 Ocular pain, left eye: Secondary | ICD-10-CM | POA: Diagnosis not present

## 2023-08-25 DIAGNOSIS — S0502XA Injury of conjunctiva and corneal abrasion without foreign body, left eye, initial encounter: Secondary | ICD-10-CM

## 2023-08-25 MED ORDER — ERYTHROMYCIN 5 MG/GM OP OINT
TOPICAL_OINTMENT | Freq: Four times a day (QID) | OPHTHALMIC | 0 refills | Status: AC
Start: 2023-08-25 — End: 2023-08-30

## 2023-08-25 NOTE — ED Triage Notes (Signed)
Pt reports left eye twitching x1 week. Pt reports last night started "feeling like something was in it and now its also painful."

## 2023-08-25 NOTE — Discharge Instructions (Addendum)
Start using the erythromycin ointment to help with eye pain and inflammation.  If eye pain worsens significantly or does not improve with eye ointment, seek care in ER.  If symptoms not fully improved with treatment, follow up with eye doctor ASAP.

## 2023-08-25 NOTE — ED Provider Notes (Signed)
[facility]    CSN: [id number] Arrival date & time: 08/25/23  1150      History   Chief Complaint Chief Complaint  Patient presents with   Eye Problem    HPI Derrick Barnes is a 37 y.o. male.   Patient presents today with 1 day history of left eye pain.  Reports prior to symptoms starting, his eye has been twitching for the past week.  He endorses photophobia and blurred vision from the left eye.  No double vision, headache, recent trauma to the eye or face.  No sinus pressure, nasal drainage, cough, fever.  Has not tried anything for symptoms so far.  Reports he does wear glasses and has been wearing sunglasses with an outdated prescription.  Last had his eyes checked in April 2024 and typically wears an up-to-date prescription.  Denies contact lens use.    Past Medical History:  Diagnosis Date   Bipolar disorder (HCC)    Diabetes mellitus without complication (HCC)    GERD (gastroesophageal reflux disease)    History of COVID-19 11/21/2020   Hypertension    Sleep apnea     Patient Active Problem List   Diagnosis Date Noted   ERRONEOUS ENCOUNTER--DISREGARD 06/22/2023   Chest pain of uncertain etiology 03/17/2023   OSA (obstructive sleep apnea) 03/17/2023   Weight loss 03/17/2023   HTN (hypertension) 03/17/2023   Nicotine abuse 03/17/2023   Black stool 05/05/2022   Dysphagia 05/05/2022   Heartburn 05/05/2022   Pain of upper abdomen 05/05/2022   Loose stools 05/05/2022   Low back pain 05/07/2021   Unilateral primary osteoarthritis, right knee     Past Surgical History:  Procedure Laterality Date   ABDOMINAL SURGERY     HERNIA REPAIR     2018 or 2019 in [place]   KNEE ARTHROSCOPY Right 01/22/2021   Procedure: RIGHT KNEE ARTHROSCOPY AND DEBRIDEMENT;  Surgeon: Nadara Mustard, MD;  Location: [facility];  Service: Orthopedics;  Laterality: Right;       Home Medications    Prior to Admission medications   Medication Sig Start Date End  Date Taking? Authorizing Provider  albuterol (VENTOLIN HFA) 108 (90 Base) MCG/ACT inhaler Inhale 1-2 puffs into the lungs every 6 (six) hours as needed for wheezing or shortness of breath. 08/08/23   Carlisle Beers, FNP  amoxicillin (AMOXIL) 500 MG tablet Take 500 mg by mouth 3 (three) times daily. 08/03/23   [provider]  benzonatate (TESSALON) 100 MG capsule Take 1 capsule (100 mg total) by mouth every 8 (eight) hours. 08/08/23   Carlisle Beers, FNP  Blood Pressure KIT 1 each by Does not apply route as directed. Dx: hypertension 03/17/23   Mallipeddi, Vishnu P, MD  ibuprofen (ADVIL) 400 MG tablet Take 400 mg by mouth every 6 (six) hours as needed. 08/03/23   [provider]  losartan (COZAAR) 100 MG tablet Take 1 tablet (100 mg total) by mouth daily. 03/17/23   Mallipeddi, Orion Modest, MD    Family History Family History  Problem Relation Age of Onset   Diabetes Father    Cancer Father    Heart disease Father    Colon cancer Paternal Uncle        46   Inflammatory bowel disease Neg Hx    Celiac disease Neg Hx     Social History Social History   Tobacco Use   Smoking status: Every Day    Current packs/day: 0.25    Types:  Cigarettes   Smokeless tobacco: Never  Vaping Use   Vaping status: Never Used  Substance Use Topics   Alcohol use: Not Currently    Comment: occ   Drug use: Not Currently     Allergies   Alfalfa and Trulicity [dulaglutide]   Review of Systems Review of Systems Per HPI  Physical Exam Triage Vital Signs ED Triage Vitals  Encounter Vitals Group     BP 08/25/23 1312 (!) 149/108     Systolic BP Percentile --      Diastolic BP Percentile --      Pulse Rate 08/25/23 1312 83     Resp 08/25/23 1312 20     Temp 08/25/23 1312 98.1 F (36.7 C)     Temp Source 08/25/23 1312 Oral     SpO2 08/25/23 1312 97 %     Weight --      Height --      Head Circumference --      Peak Flow --      Pain Score 08/25/23 1311 8     Pain Loc  --      Pain Education --      Exclude from Growth Chart --    No data found.  Updated Vital Signs BP (!) 149/108 (BP Location: Right Arm) Comment: is taking htn meds. has not taken bp meds today. pt currently asymptomatic. nad noted.  Pulse 83   Temp 98.1 F (36.7 C) (Oral)   Resp 20   SpO2 97%   Visual Acuity Right Eye Distance: 20/40 Left Eye Distance: 20/50 Bilateral Distance: 20/50  Right Eye Near:   Left Eye Near:    Bilateral Near:     Physical Exam Vitals and nursing note reviewed.  Constitutional:      General: He is not in acute distress.    Appearance: Normal appearance. He is not toxic-appearing.  HENT:     Head: Normocephalic and atraumatic.     Right Ear: External ear normal.     Left Ear: External ear normal.     Nose: Nose normal. No congestion or rhinorrhea.     Mouth/Throat:     Mouth: Mucous membranes are moist.     Pharynx: Oropharynx is clear. No oropharyngeal exudate or posterior oropharyngeal erythema.  Eyes:     General: Lids are normal. No allergic shiner, visual field deficit or scleral icterus.       Right eye: No foreign body or discharge.        Left eye: No foreign body or discharge.     Extraocular Movements: Extraocular movements intact.     Right eye: Normal extraocular motion.     Left eye: Normal extraocular motion.     Conjunctiva/sclera:     Right eye: Right conjunctiva is not injected. No hemorrhage.    Left eye: Left conjunctiva is not injected. No hemorrhage.    Pupils: Pupils are equal, round, and reactive to light.     Left eye: Corneal abrasion and fluorescein uptake present.  Pulmonary:     Effort: Pulmonary effort is normal. No respiratory distress.  Skin:    General: Skin is warm and dry.     Coloration: Skin is not jaundiced or pale.     Findings: No bruising, erythema or rash.  Neurological:     Mental Status: He is alert and oriented to person, place, and time.  Psychiatric:        Behavior: Behavior is  cooperative.  UC Treatments / Results  Labs (all labs ordered are listed, but only abnormal results are displayed) Labs Reviewed - No data to display  EKG   Radiology No results found.  Procedures Procedures (including critical care time)  Medications Ordered in UC Medications - No data to display  Initial Impression / Assessment and Plan / UC Course  I have reviewed the triage vital signs and the nursing notes.  Pertinent labs & imaging results that were available during my care of the patient were reviewed by me and considered in my medical decision making (see chart for details).   Patient is well-appearing, afebrile, not tachycardic, not tachypneic, oxygenating well on room air.  Patient is mildly hypertensive in urgent care today.  1. Pain of left eye 2. Photosensitivity 3. Abrasion of left cornea, initial encounter No red flags  Treat with erythromycin ointment 4 times daily for 5 days Strict ER precautions discussed with patient Recommended follow up with ophthalmologist symptoms do not fully improve with treatment  The patient was given the opportunity to ask questions.  All questions answered to their satisfaction.  The patient is in agreement to this plan.    Final Clinical Impressions(s) / UC Diagnoses   Final diagnoses:  None   Discharge Instructions   None    ED Prescriptions   None    PDMP not reviewed this encounter.   Valentino Nose, NP 08/25/23 1450

## 2023-08-26 ENCOUNTER — Encounter (HOSPITAL_COMMUNITY): Payer: Self-pay

## 2023-08-26 ENCOUNTER — Emergency Department (HOSPITAL_COMMUNITY)
Admission: EM | Admit: 2023-08-26 | Discharge: 2023-08-26 | Discharge status: Against Medical Advice | Payer: Medicaid Other | Attending: Emergency Medicine | Admitting: Emergency Medicine

## 2023-08-26 DIAGNOSIS — Z5321 Procedure and treatment not carried out due to patient leaving prior to being seen by health care provider: Secondary | ICD-10-CM | POA: Diagnosis not present

## 2023-08-26 DIAGNOSIS — H538 Other visual disturbances: Secondary | ICD-10-CM | POA: Insufficient documentation

## 2023-08-26 NOTE — ED Notes (Signed)
No answer, not in WR

## 2023-08-26 NOTE — ED Triage Notes (Signed)
Pt arrives with c/o left eye pressure that started yesterday. Pt endorses blurry vision in left eye. Per pt, feels like there is something in his eye.

## 2023-08-30 ENCOUNTER — Other Ambulatory Visit: Payer: Self-pay

## 2023-08-30 ENCOUNTER — Emergency Department (HOSPITAL_COMMUNITY)
Admission: EM | Admit: 2023-08-30 | Discharge: 2023-08-30 | Disposition: A | Discharge status: Home / Self Care | Payer: Medicaid Other | Attending: Emergency Medicine | Admitting: Emergency Medicine

## 2023-08-30 ENCOUNTER — Emergency Department (HOSPITAL_COMMUNITY): Payer: Medicaid Other

## 2023-08-30 ENCOUNTER — Encounter (HOSPITAL_COMMUNITY): Payer: Self-pay

## 2023-08-30 DIAGNOSIS — I10 Essential (primary) hypertension: Secondary | ICD-10-CM | POA: Diagnosis not present

## 2023-08-30 DIAGNOSIS — Z79899 Other long term (current) drug therapy: Secondary | ICD-10-CM | POA: Diagnosis not present

## 2023-08-30 DIAGNOSIS — S6991XA Unspecified injury of right wrist, hand and finger(s), initial encounter: Secondary | ICD-10-CM | POA: Diagnosis present

## 2023-08-30 DIAGNOSIS — W228XXA Striking against or struck by other objects, initial encounter: Secondary | ICD-10-CM | POA: Insufficient documentation

## 2023-08-30 DIAGNOSIS — S60221A Contusion of right hand, initial encounter: Secondary | ICD-10-CM | POA: Insufficient documentation

## 2023-08-30 MED ORDER — IBUPROFEN 600 MG PO TABS: 600.0000 mg | ORAL_TABLET | Freq: Four times a day (QID) | ORAL | 0 refills | Status: AC | PRN

## 2023-08-30 MED ORDER — KETOROLAC TROMETHAMINE 10 MG PO TABS
10.0000 mg | ORAL_TABLET | Freq: Once | ORAL | Status: AC
  Administered 2023-08-30: 10 mg via ORAL
  Filled 2023-08-30: qty 1

## 2023-08-30 NOTE — ED Triage Notes (Signed)
C/o right hand pain radiating into wrist that started today after hitting a door.  Ibuprofen and tylenol w/ relief. Right radial pulse noted ROM noted with pain

## 2023-08-30 NOTE — Discharge Instructions (Addendum)
Seen in the ER today for injury to your right hand.  Your x-rays today did not show any broken bones or dislocation, only some soft tissue swelling.  You likely have a bruise or sprain.  You can use ibuprofen, Tylenol over-the-counter as doctored on the packaging as needed for pain. Follow up with your PCP. You need to rest the hand, use ice for 15 minutes at a time several times a day for the next 2 days., keep the hand elevated, use the Ace wrap to help with compression and support.

## 2023-08-30 NOTE — ED Provider Notes (Cosign Needed Addendum)
[facility] EMERGENCY DEPARTMENT AT [facility] Provider Note   CSN: [id number] Arrival date & time: 08/30/23  1634     History  Chief Complaint  Patient presents with   Hand Pain    Derrick Barnes is a 37 y.o. male.  He has PMH of hypertension and sleep apnea presents to ER for right hand and wrist pain after a door today.  States initially the pain was limited to the little finger but he had just taking some ibuprofen and Tylenol for a toothache and as the day went on he got worse and pain in the fourth and third fingers as well, the fourth and fifth metacarpals and the ulnar aspect of the wrist.  No numbness, no limited range of motion, reports some mild tingling in dorsum of the hand.   Hand Pain       Home Medications Prior to Admission medications   Medication Sig Start Date End Date Taking? Authorizing Provider  albuterol (VENTOLIN HFA) 108 (90 Base) MCG/ACT inhaler Inhale 1-2 puffs into the lungs every 6 (six) hours as needed for wheezing or shortness of breath. 08/08/23   Carlisle Beers, FNP  benzonatate (TESSALON) 100 MG capsule Take 1 capsule (100 mg total) by mouth every 8 (eight) hours. 08/08/23   Carlisle Beers, FNP  Blood Pressure KIT 1 each by Does not apply route as directed. Dx: hypertension 03/17/23   Mallipeddi, Vishnu P, MD  erythromycin ophthalmic ointment Place into the left eye 4 (four) times daily for 5 days. Place a 1/2 inch ribbon of ointment into the lower eyelid. 08/25/23 08/30/23  Valentino Nose, NP  ibuprofen (ADVIL) 600 MG tablet Take 1 tablet (600 mg total) by mouth every 6 (six) hours as needed. 08/30/23   Carmel Sacramento A, PA-C  losartan (COZAAR) 100 MG tablet Take 1 tablet (100 mg total) by mouth daily. 03/17/23   Mallipeddi, Vishnu P, MD      Allergies    Alfalfa and Trulicity [dulaglutide]    Review of Systems   Review of Systems  Physical Exam Updated Vital Signs BP (!) 163/107   Pulse (!) 102   Temp 99.3 F  (37.4 C) (Oral)   Resp 16   Wt (!) 152 kg   SpO2 96%   BMI 40.78 kg/m  Physical Exam Vitals and nursing note reviewed.  Constitutional:      General: He is not in acute distress.    Appearance: He is well-developed.  HENT:     Head: Normocephalic and atraumatic.  Eyes:     Conjunctiva/sclera: Conjunctivae normal.  Cardiovascular:     Rate and Rhythm: Normal rate and regular rhythm.     Heart sounds: No murmur heard. Pulmonary:     Effort: Pulmonary effort is normal. No respiratory distress.     Breath sounds: Normal breath sounds.  Abdominal:     Palpations: Abdomen is soft.     Tenderness: There is no abdominal tenderness.  Musculoskeletal:     Cervical back: Neck supple.     Comments: Patient has mild swelling to the dorsum of the right hand over the fourth and fifth metacarpals but no deformities.  He is able to fully flex and extend all joints of all fingers of the right hand, able to flex and extend right wrist without difficulty.  No snuffbox tenderness.  Capillary refill is brisk in the fingertips, radial pulse is intact, normal sensation to light touch over right hand diffusely  Skin:  General: Skin is warm and dry.     Capillary Refill: Capillary refill takes less than 2 seconds.  Neurological:     General: No focal deficit present.     Mental Status: He is alert and oriented to person, place, and time.  Psychiatric:        Mood and Affect: Mood normal.     ED Results / Procedures / Treatments   Labs (all labs ordered are listed, but only abnormal results are displayed) Labs Reviewed - No data to display  EKG None  Radiology DG Hand Complete Right  Result Date: 08/30/2023 CLINICAL DATA:  Right hand and wrist pain after hitting a door. EXAM: RIGHT WRIST - COMPLETE 3+ VIEW; RIGHT HAND - COMPLETE 3+ VIEW COMPARISON:  Wrist radiographs 03/25/2022 FINDINGS: Hand: There is no evidence of fracture or dislocation. There is no evidence of arthropathy or other  focal bone abnormality. Mild dorsal soft tissue edema. Wrist: No evidence of fracture or dislocation. The alignment joint spaces are preserved. The carpal bones are intact. No arthropathy. Unremarkable soft tissues. IMPRESSION: Mild dorsal soft tissue edema. No fracture or dislocation of the hand or wrist. Electronically Signed   By: Narda Rutherford M.D.   On: 08/30/2023 17:33   DG Wrist Complete Right  Result Date: 08/30/2023 CLINICAL DATA:  Right hand and wrist pain after hitting a door. EXAM: RIGHT WRIST - COMPLETE 3+ VIEW; RIGHT HAND - COMPLETE 3+ VIEW COMPARISON:  Wrist radiographs 03/25/2022 FINDINGS: Hand: There is no evidence of fracture or dislocation. There is no evidence of arthropathy or other focal bone abnormality. Mild dorsal soft tissue edema. Wrist: No evidence of fracture or dislocation. The alignment joint spaces are preserved. The carpal bones are intact. No arthropathy. Unremarkable soft tissues. IMPRESSION: Mild dorsal soft tissue edema. No fracture or dislocation of the hand or wrist. Electronically Signed   By: Narda Rutherford M.D.   On: 08/30/2023 17:33    Procedures Procedures    Medications Ordered in ED Medications  ketorolac (TORADOL) tablet 10 mg (has no administration in time range)    ED Course/ Medical Decision Making/ A&P                                 Medical Decision Making Patient presented to the ER for evaluation of right hand injury after punching a door today.  The differential diagnosis includes but not limited to fracture, sprain, strain, contusion, dislocation, tendinitis, other  X-rays ordered and interpreted by me, x-ray right wrist and right hand but showed no fracture, no traumatic malalignment within the scope of determining emergent findings, agree with radiology read  ED course: Patient having right hand pain after striking a wall, discussed some mild swelling but neurovascular status is intact.  X-ray showed no abnormalities.  Advised on  RICE therapy, acids, PCP follow-up and was given strict return precautions.  Amount and/or Complexity of Data Reviewed Radiology: ordered.  Risk Prescription drug management.           Final Clinical Impression(s) / ED Diagnoses Final diagnoses:  Contusion of right hand, initial encounter    Rx / DC Orders ED Discharge Orders          Ordered    ibuprofen (ADVIL) 600 MG tablet  Every 6 hours PRN        08/30/23 1746              Geronimo, Overlea  A, PA-C 08/30/23 1800    Josem Kaufmann 08/30/23 1803    Eber Hong, MD 08/31/23 1530

## 2024-02-25 ENCOUNTER — Encounter: Payer: Self-pay | Admitting: Orthopaedic Surgery

## 2024-02-25 ENCOUNTER — Ambulatory Visit (INDEPENDENT_AMBULATORY_CARE_PROVIDER_SITE_OTHER): Admitting: Orthopaedic Surgery

## 2024-02-25 ENCOUNTER — Other Ambulatory Visit: Payer: Self-pay

## 2024-02-25 DIAGNOSIS — S22080A Wedge compression fracture of T11-T12 vertebra, initial encounter for closed fracture: Secondary | ICD-10-CM
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
## 2024-03-28 ENCOUNTER — Ambulatory Visit (INDEPENDENT_AMBULATORY_CARE_PROVIDER_SITE_OTHER): Admitting: Audiology

## 2024-03-28 ENCOUNTER — Institutional Professional Consult (permissible substitution) (INDEPENDENT_AMBULATORY_CARE_PROVIDER_SITE_OTHER): Admitting: Otolaryngology

## 2024-04-01 ENCOUNTER — Emergency Department (HOSPITAL_COMMUNITY)
Admission: EM | Admit: 2024-04-01 | Discharge: 2024-04-02 | Discharge status: Against Medical Advice | Attending: Emergency Medicine | Admitting: Emergency Medicine

## 2024-04-01 ENCOUNTER — Other Ambulatory Visit: Payer: Self-pay

## 2024-04-01 DIAGNOSIS — M549 Dorsalgia, unspecified: Secondary | ICD-10-CM | POA: Insufficient documentation

## 2024-04-01 DIAGNOSIS — Z5321 Procedure and treatment not carried out due to patient leaving prior to being seen by health care provider: Secondary | ICD-10-CM | POA: Diagnosis not present
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
## 2024-05-11 ENCOUNTER — Ambulatory Visit: Admitting: Orthopedic Surgery

## 2024-05-11 ENCOUNTER — Other Ambulatory Visit (INDEPENDENT_AMBULATORY_CARE_PROVIDER_SITE_OTHER): Payer: Self-pay

## 2024-05-11 VITALS — BP 163/102 | HR 85 | Ht 76.0 in | Wt 328.8 lb

## 2024-05-11 DIAGNOSIS — S22080A Wedge compression fracture of T11-T12 vertebra, initial encounter for closed fracture: Secondary | ICD-10-CM
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
## 2024-05-23 NOTE — ED Provider Notes (Signed)
 Emergency Department Provider Note    ED Clinical Impression   Final diagnoses:  Thoracic myofascial strain, initial encounter (Primary)    ED Assessment/Plan    Condition: Stable Disposition: Discharge  This chart has been completed using Dragon Medical Dictation software, and while attempts have been made to ensure accuracy, certain words and phrases may not be transcribed as intended.   History   Chief Complaint  Patient presents with  . Back Pain    Middle back pain x 3 weeks, worse tonight.    HPI  Derrick Barnes is a 38 y.o. male  who presents today to the  emergency department complaining of midthoracic pain for the past 3 weeks.  Patient states that pain got worse today when he turned and twisted his back at work.  He describes symptoms as moderate.  He states he has a history of a T12 fracture.  He denies any bowel or bladder incontinence.  He describes him tingling in his neck.    Allergies: is allergic to alfalfa and dulaglutide. Medications: is not on any long-term medications. PMHx:  has no past medical history on file. PSHx:  has no past surgical history on file. SocHx:   Allergies, Medications, Medical, Surgical, and Social History were reviewed as documented above.   Social Drivers of Health with Concerns   Food Insecurity: Not on file  Tobacco Use: High Risk (02/25/2024)   Received from [facility]   Patient History   . Smoking Tobacco Use: Every Day   . Smokeless Tobacco Use: Never   . Passive Exposure: Not on file  Transportation Needs: Not on file  Alcohol Use: Not on file  Housing: Not on file  Physical Activity: Not on file  Utilities: Not on file  Stress: Not on file  Interpersonal Safety: Not on file  Substance Use: Not on file (05/23/2024)  Intimate Partner Violence: Not on file  Social Connections: Not on file  Financial Resource Strain: Not on file   Health Literacy: Not on file  Internet Connectivity: Not on file     Review Of Systems  Review of Systems  Constitutional:  Negative for fever.  HENT:  Negative for congestion.   Respiratory:  Negative for chest tightness and shortness of breath.   Cardiovascular:  Negative for chest pain.  Gastrointestinal:  Negative for abdominal pain.  Musculoskeletal:  Positive for back pain.  Skin:  Negative for color change.  Psychiatric/Behavioral:  Negative for behavioral problems.   All other systems reviewed and are negative.   Physical Exam   BP 144/103   Pulse 80   Temp 36.5 C (97.7 F) (Temporal)   Resp 18   Ht 193 cm (6' 4)   Wt (!) 146.6 kg (323 lb 3.2 oz)   SpO2 99%   BMI 39.34 kg/m   Physical Exam Vitals and nursing note reviewed.  Constitutional:      General: He is not in acute distress. HENT:     Head: Normocephalic.  Eyes:     Conjunctiva/sclera: Conjunctivae normal.  Cardiovascular:     Rate and Rhythm: Regular rhythm.     Pulses: Normal pulses.     Heart sounds: Normal heart sounds.  Pulmonary:     Effort: No respiratory distress.     Breath sounds: Normal breath  sounds.  Abdominal:     General: There is no distension.  Musculoskeletal:        General: Tenderness present. No deformity.     Comments: There are some thoracic paravertebral tenderness.  No step-offs.  No saddle anesthesia.  Negative straight leg raise test.  Skin:    General: Skin is warm.     Capillary Refill: Capillary refill takes 2 to 3 seconds.     Comments: Normal cap refill.  Neurological:     General: No focal deficit present.  Psychiatric:        Mood and Affect: Mood normal.     ED Course  Medical Decision Making Differential is includes thoracic strain versus exacerbation of fracture versus pinched nerve.  No clinical suspicion for acute cord compression.  Will get plain films.  3:06 AM Patient is doing well.  Feels better.  Stable for discharge.  I have reviewed  my clinical findings and studies and my clinical impression with the patient. The patient has expressed understanding that at this time there is no evidence for a more malignant underlying process, but the patient also understands that early in the process of a condition such as this, an initial workup can be falsely reassuring. I have counseled the patient and discussed follow-up with the patient, stressing the importance of appropriate follow-up. I have also counseled the patient to return if worse or any concerns. Routine discharge counseling was given to the patient and the patient understands that worsening, changing or persistent symptoms should prompt an immediate call or follow up with their primary physician or return to the emergency department for reevaluation. Patient has expressed understanding.     Problems Addressed: Thoracic myofascial strain, initial encounter: acute illness or injury that poses a threat to life or bodily functions  Amount and/or Complexity of Data Reviewed Radiology: ordered and independent interpretation performed. Decision-making details documented in ED Course.  Risk Prescription drug management. Decision regarding hospitalization.     Procedures   No results found for this visit on 05/22/24 (from the past 4464 hours).   ED Results No results found for any visits on 05/22/24. XR Thoracic Spine 2 Views Result Date: 05/23/2024 Exam: Thoracic Spine  History:  Back pain  Technique:  2 views  Comparison:  None.  Findings:  Examination limited by suboptimal visualization of the distal cervical and proximal thoracic vertebrae on the lateral and swimmer's views Normal alignment of the visualized vertebrae. Preserved vertebral body and intervertebral disc heights. No displaced fracture or subluxation. No suspicious bony lesion.  Unremarkable soft tissues.    Negative exam limited as above.    Signed (Electronic Signature): 05/23/2024 2:55 AM Signed By: Rozann Kail, MD   Medications Administered:  Medications  ketorolac  (TORADOL ) injection 30 mg (30 mg Intramuscular Given 05/23/24 0104)  methocarbamol (ROBAXIN) tablet 500 mg (500 mg Oral Given 05/23/24 0104)    Discharge Medications (Medications Prescribed during this  ED visit and Patient's Home Medications) :    Your Medication List     START taking these medications    methocarbamol 500 MG tablet Commonly known as: ROBAXIN Take 1 tablet (500 mg total) by mouth two (2) times a day for 10 days.   naproxen  500 MG tablet Commonly known as: NAPROSYN  Take 1 tablet (500 mg total) by mouth two (2) times a day as needed (pain).          Cherie Ardeen Hanger, MD 05/23/24 [phone]

## 2024-06-13 ENCOUNTER — Ambulatory Visit: Admitting: Orthopedic Surgery

## 2024-06-13 DIAGNOSIS — M546 Pain in thoracic spine: Secondary | ICD-10-CM | POA: Diagnosis not present

## 2024-06-13 DIAGNOSIS — G8929 Other chronic pain: Secondary | ICD-10-CM | POA: Diagnosis not present

## 2024-06-13 NOTE — Progress Notes (Signed)
 Orthopedic Spine Surgery Office Note   Assessment: Patient is a 38 y.o. male with T11 compression fracture and chronic thoracic back pain.  No radicular symptoms     Plan: -Patient has tried Tylenol , Advil , bracing -Even though patient has 7 months of symptoms and has not noticed improvement with conservative treatment, his MRI was denied by his insurance. Since I cannot obtain the MRI to see if his fracture healed or to work up his pain further, I discussed pain management as his main treatment.  Provided him with a referral to [facility].  His SO then inquired about a spinal cord stimulator.  I said he is not the ideal candidate but that can help people where their main symptom is back pain.  They are interested in exploring that option.  Referral provided to Dr. Lyda office. -Patient should return to office on an as needed basis   ___________________________________________________________________________     History:   Patient is a 38 y.o. male who presents today for continued thoracic back pain.  He has not noticed any changes in his symptoms since he was last seen.  He has no pain radiating to either lower extremity.  His MRI was denied by his insurance company.  He feels the pain on a daily basis.  He feels the pain with activity and at rest.   Treatments tried: Bracing, Tylenol , Advil     Physical Exam:  General: no acute distress, appears stated age Neurologic: alert, answering questions appropriately, following commands Respiratory: unlabored breathing on room air, symmetric chest rise Psychiatric: appropriate affect, normal cadence to speech     MSK (spine):   -Strength exam                                                   Left                  Right EHL                              5/5                  5/5 TA                                 5/5                  5/5 GSC                             5/5                  5/5 Knee extension            5/5                   5/5 Hip flexion                    5/5                  5/5   -Sensory exam  Sensation intact to light touch in L3-S1 nerve distributions of bilateral lower extremities    Imaging: XRs of the thoracic spine from 05/11/2024 were previously independently reviewed and interpreted, showing compression fracture in the lower thoracic spine with anterior height loss. Posterior vertebral height is maintained when measured and compared to the adjacent level. No other fractures seen. No dislocation seen.      Patient name: Derrick Barnes Patient MRN: [id number] Date of visit: 06/13/24

## 2024-06-19 ENCOUNTER — Other Ambulatory Visit: Payer: Self-pay

## 2024-06-19 ENCOUNTER — Emergency Department (HOSPITAL_COMMUNITY): Payer: Worker's Compensation

## 2024-06-19 ENCOUNTER — Encounter (HOSPITAL_COMMUNITY): Payer: Self-pay | Admitting: Emergency Medicine

## 2024-06-19 ENCOUNTER — Emergency Department (HOSPITAL_COMMUNITY)
Admission: EM | Admit: 2024-06-19 | Discharge: 2024-06-19 | Disposition: A | Discharge status: Home / Self Care | Payer: Worker's Compensation | Attending: Emergency Medicine | Admitting: Emergency Medicine

## 2024-06-19 DIAGNOSIS — Y990 Civilian activity done for income or pay: Secondary | ICD-10-CM | POA: Diagnosis not present

## 2024-06-19 DIAGNOSIS — S8392XA Sprain of unspecified site of left knee, initial encounter: Secondary | ICD-10-CM

## 2024-06-19 DIAGNOSIS — S8992XA Unspecified injury of left lower leg, initial encounter: Secondary | ICD-10-CM | POA: Insufficient documentation

## 2024-06-19 DIAGNOSIS — X58XXXA Exposure to other specified factors, initial encounter: Secondary | ICD-10-CM | POA: Insufficient documentation

## 2024-06-19 NOTE — ED Provider Notes (Addendum)
 [facility] AT [facility] Provider Note   CSN: [id number] Arrival date & time: 06/19/24  [id number]     Patient presents with: Knee Pain   Derrick Barnes is a 38 y.o. male.   Patient is a 38 year old male presenting with complaints of left knee pain.  He injured it at work several days ago.  He had a video visit and was determined to have a sprain.  He presents here with ongoing discomfort.  Pain is worse with ambulation and relieved with rest.  He reports a history of a meniscal tear requiring surgery in his right knee and this feels similar.       Prior to Admission medications   Medication Sig Start Date End Date Taking? Authorizing Provider  albuterol  (VENTOLIN  HFA) 108 (90 Base) MCG/ACT inhaler Inhale 1-2 puffs into the lungs every 6 (six) hours as needed for wheezing or shortness of breath. 08/08/23   Enedelia Dorna HERO, FNP  benzonatate  (TESSALON ) 100 MG capsule Take 1 capsule (100 mg total) by mouth every 8 (eight) hours. Patient not taking: Reported on 02/25/2024 08/08/23   Enedelia Dorna HERO, FNP  Blood Pressure KIT 1 each by Does not apply route as directed. Dx: hypertension 03/17/23   Mallipeddi, Vishnu P, MD  ibuprofen  (ADVIL ) 600 MG tablet Take 1 tablet (600 mg total) by mouth every 6 (six) hours as needed. 08/30/23   Suellen Cantor A, PA-C  losartan  (COZAAR ) 100 MG tablet Take 1 tablet (100 mg total) by mouth daily. 03/17/23   Mallipeddi, Diannah SQUIBB, MD    Allergies: Alfalfa and Trulicity [dulaglutide]    Review of Systems  All other systems reviewed and are negative.   Updated Vital Signs BP (!) 173/116 (BP Location: Right Arm)   Pulse 92   Temp (!) 97 F (36.1 C) (Oral)   Resp 20   Ht 6' 4 (1.93 m)   Wt (!) 149 kg   SpO2 100%   BMI 39.98 kg/m   Physical Exam Vitals and nursing note reviewed.  Constitutional:      Appearance: Normal appearance.  HENT:     Head: Normocephalic.  Pulmonary:     Effort: Pulmonary effort is  normal.  Musculoskeletal:     Comments: The left knee has a palpable effusion.  He has good range of motion with no crepitus.  Anterior and posterior drawer test is negative.  There is no laxity with varus or valgus stress.  Skin:    General: Skin is warm and dry.  Neurological:     Mental Status: He is alert and oriented to person, place, and time.     (all labs ordered are listed, but only abnormal results are displayed) Labs Reviewed - No data to display  EKG: None  Radiology: No results found.   Procedures   Medications Ordered in the ED - No data to display                                  Medical Decision Making Amount and/or Complexity of Data Reviewed Radiology: ordered.   Knee x-rays are negative.  I suspect some sort of sprain or possibly internal derangement involving the meniscus.  Patient will be placed in an Ace bandage advised to rest, ice, elevate, and follow-up with orthopedics.     Final diagnoses:  None    ED Discharge Orders     None  Geroldine Berg, MD 06/19/24 [id number]    Geroldine Berg, MD 06/19/24 [phone]

## 2024-06-19 NOTE — Discharge Instructions (Signed)
 Take ibuprofen  600 mg every 6 hours as needed for pain.  Wear Ace bandage or knee sleeve for comfort and support.  Follow-up with orthopedics if symptoms are not improving in the next week.  The contact information for Dr. Harden has been provided in this discharge summary for you to call and make these arrangements.  He is the surgeon that performed the procedure on your right knee.

## 2024-06-19 NOTE — ED Triage Notes (Signed)
 Pt with c/o L knee pain since injuring it at work on Monday. States he saw an MD via video chat at that time but continues to have pain. Here for further work-up.

## 2024-07-01 ENCOUNTER — Encounter: Payer: Self-pay | Admitting: Radiology

## 2024-09-12 ENCOUNTER — Encounter: Payer: Self-pay | Admitting: Radiology
